# Patient Record
Sex: Female | Born: 1995 | Race: Black or African American | Hispanic: No | Marital: Single
Health system: Midwestern US, Community
[De-identification: ages and names within clinical notes are randomized; demographics above are authoritative.]

## PROBLEM LIST (undated history)

## (undated) ENCOUNTER — Inpatient Hospital Stay (HOSPITAL_COMMUNITY): Payer: Self-pay

## (undated) DIAGNOSIS — Z3400 Encounter for supervision of normal first pregnancy, unspecified trimester: Secondary | ICD-10-CM

## (undated) DIAGNOSIS — Z363 Encounter for antenatal screening for malformations: Secondary | ICD-10-CM

## (undated) DIAGNOSIS — Z3490 Encounter for supervision of normal pregnancy, unspecified, unspecified trimester: Secondary | ICD-10-CM

## (undated) DIAGNOSIS — Z362 Encounter for other antenatal screening follow-up: Secondary | ICD-10-CM

## (undated) DIAGNOSIS — F329 Major depressive disorder, single episode, unspecified: Secondary | ICD-10-CM

## (undated) DIAGNOSIS — F419 Anxiety disorder, unspecified: Secondary | ICD-10-CM

## (undated) DIAGNOSIS — F32A Depression, unspecified: Secondary | ICD-10-CM

## (undated) DIAGNOSIS — J45909 Unspecified asthma, uncomplicated: Secondary | ICD-10-CM

## (undated) HISTORY — PX: NO PAST SURGERIES: SHX2092

## (undated) MED ORDER — MEDROXYPROGESTERONE (DEPO-PROVERA) 150 MG/ML IM SYRINGE
150 mg/mL | INJECTION | Freq: Once | INTRAMUSCULAR | Status: AC
Start: ? — End: 2012-09-01

## (undated) MED ORDER — PRENATAL VIT-IRON FUMARATE-FA 28 MG-0.8 MG TABLET
28 mg iron- 800 mcg | ORAL_TABLET | Freq: Every day | ORAL | Status: DC
Start: ? — End: 2012-06-30

## (undated) MED ORDER — OXYCODONE-ACETAMINOPHEN 5 MG-325 MG TAB
5-325 mg | ORAL_TABLET | ORAL | Status: DC | PRN
Start: ? — End: 2012-09-01

## (undated) MED ORDER — PRENATAL VIT-IRON FUMARATE-FA 28 MG-0.8 MG TABLET: 28 mg iron- 800 mcg | ORAL_TABLET | Freq: Every day | ORAL | Status: AC

---
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
## 2014-02-25 ENCOUNTER — Encounter (HOSPITAL_COMMUNITY): Payer: Self-pay | Admitting: Emergency Medicine

## 2014-02-25 ENCOUNTER — Emergency Department (HOSPITAL_COMMUNITY)
Admission: EM | Admit: 2014-02-25 | Discharge: 2014-02-25 | Disposition: A | Discharge status: Home / Self Care | Payer: Medicaid Other | Attending: Emergency Medicine | Admitting: Emergency Medicine

## 2014-02-25 DIAGNOSIS — J45909 Unspecified asthma, uncomplicated: Secondary | ICD-10-CM | POA: Diagnosis not present

## 2014-02-25 DIAGNOSIS — Z7952 Long term (current) use of systemic steroids: Secondary | ICD-10-CM | POA: Insufficient documentation

## 2014-02-25 DIAGNOSIS — T148XXA Other injury of unspecified body region, initial encounter: Secondary | ICD-10-CM

## 2014-02-25 DIAGNOSIS — Z79899 Other long term (current) drug therapy: Secondary | ICD-10-CM | POA: Diagnosis not present

## 2014-02-25 DIAGNOSIS — M7981 Nontraumatic hematoma of soft tissue: Secondary | ICD-10-CM | POA: Diagnosis not present

## 2014-02-25 LAB — CBC WITH DIFFERENTIAL/PLATELET
BASOS ABS: 0 10*3/uL (ref 0.0–0.1)
Basophils Relative: 1 % (ref 0–1)
Eosinophils Absolute: 0.1 10*3/uL (ref 0.0–0.7)
Eosinophils Relative: 2 % (ref 0–5)
HEMATOCRIT: 40.1 % (ref 36.0–46.0)
Hemoglobin: 12.9 g/dL (ref 12.0–15.0)
LYMPHS ABS: 1.5 10*3/uL (ref 0.7–4.0)
Lymphocytes Relative: 30 % (ref 12–46)
MCH: 28.2 pg (ref 26.0–34.0)
MCHC: 32.2 g/dL (ref 30.0–36.0)
MCV: 87.7 fL (ref 78.0–100.0)
MONO ABS: 1.1 10*3/uL — AB (ref 0.1–1.0)
MONOS PCT: 21 % — AB (ref 3–12)
NEUTROS PCT: 46 % (ref 43–77)
Neutro Abs: 2.4 10*3/uL (ref 1.7–7.7)
PLATELETS: 343 10*3/uL (ref 150–400)
RBC: 4.57 MIL/uL (ref 3.87–5.11)
RDW: 13.1 % (ref 11.5–15.5)
WBC: 5.1 10*3/uL (ref 4.0–10.5)

## 2014-02-25 LAB — COMPREHENSIVE METABOLIC PANEL
ALBUMIN: 3.9 g/dL (ref 3.5–5.2)
ALK PHOS: 59 U/L (ref 39–117)
ALT: 7 U/L (ref 0–35)
ANION GAP: 13 (ref 5–15)
AST: 15 U/L (ref 0–37)
BILIRUBIN TOTAL: 0.3 mg/dL (ref 0.3–1.2)
BUN: 13 mg/dL (ref 6–23)
CHLORIDE: 102 meq/L (ref 96–112)
CO2: 24 mEq/L (ref 19–32)
Calcium: 9.1 mg/dL (ref 8.4–10.5)
Creatinine, Ser: 0.83 mg/dL (ref 0.50–1.10)
GFR calc Af Amer: >90 mL/min (ref >90)
GFR calc non Af Amer: >90 mL/min (ref >90)
Glucose, Bld: 84 mg/dL (ref 70–99)
Potassium: 4 mEq/L (ref 3.7–5.3)
SODIUM: 139 meq/L (ref 137–147)
Total Protein: 7.5 g/dL (ref 6.0–8.3)

## 2014-02-25 LAB — PROTIME-INR
INR: 1.11 (ref 0.00–1.49)
Prothrombin Time: 14.4 seconds (ref 11.6–15.2)

## 2014-02-25 LAB — RETICULOCYTES
RBC.: 4.57 MIL/uL (ref 3.87–5.11)
RETIC COUNT ABSOLUTE: 64 10*3/uL (ref 19.0–186.0)
Retic Ct Pct: 1.4 % (ref 0.4–3.1)

## 2014-02-25 LAB — APTT: APTT: 37 s (ref 24–37)

## 2014-02-25 MED ORDER — OXYCODONE-ACETAMINOPHEN 5-325 MG PO TABS
1.0000 | ORAL_TABLET | Freq: Once | ORAL | Status: AC
  Administered 2014-02-25: 1 via ORAL
  Filled 2014-02-25: qty 1

## 2014-02-25 MED ORDER — TRAMADOL-ACETAMINOPHEN 37.5-325 MG PO TABS: 1.0000 | ORAL_TABLET | Freq: Four times a day (QID) | ORAL | Status: DC | PRN

## 2014-02-25 NOTE — Discharge Instructions (Signed)
Take the prescribed medication as directed. Follow-up with hematology-- see list attached on back. Return to the ED for new or worsening symptoms.

## 2014-02-25 NOTE — ED Notes (Signed)
For a year she has had  bruising spots that have  up and generalized pain sob and cp for a year also has been to see a dr but has no dx yet pain has been getting worse for the last couple of months nights are worse

## 2014-02-25 NOTE — ED Provider Notes (Signed)
CSN: [id number]     Arrival date & time 02/25/14  1125 History  This chart was scribed for non-physician practitioner, Sharilyn SitesLisa Hayley Horn, PA-C,working with Warnell Foresterrey Wofford, MD, by Karle PlumberJennifer Tensley, ED Scribe. This patient was seen in room TR07C/TR07C and the patient's care was started at 12:07 PM.  Chief Complaint  Patient presents with  . Bleeding/Bruising   The history is provided by the patient. No language interpreter was used.    HPI Comments:  Rebecca Eaton is a 18 y.o. female with PMH of asthma who presents to the Emergency Department complaining of spontaneous, mildly painful bruises to all extremities and abdomen that began several months ago. She reports associated constant generalized dull body pain but cannot describe the pain. She states it is worse at night and has occasional sharp chest pains, none currently. Pt was seen at an urgent care in [place] in the past with no specific diagnosis or report of the blood work. States she is starting a new job tomorrow and wanted to be checked out before. Denies modifying factors. Denies fevers, chills, numbness, tingling, anticoagulant problems, anemia, abdominal pain or current CP. Pt is not on anticoagulant therapy and denies taking ASA. Denies family h/o sickle cell or clotting disorders. Denies trauma, injury or falls recently.  Past Medical History  Diagnosis Date  . Asthma    History reviewed. No pertinent past surgical history. No family history on file. History  Substance Use Topics  . Smoking status: Never Smoker   . Smokeless tobacco: Not on file  . Alcohol Use: No   OB History    No data available     Review of Systems  Constitutional: Negative for fever and chills.  Cardiovascular: Negative for chest pain.  Gastrointestinal: Negative for abdominal pain.  Musculoskeletal: Positive for myalgias.  Neurological: Negative for numbness.  Hematological: Bruises/bleeds easily.  All other systems reviewed and are  negative.   Allergies  Review of patient's allergies indicates no known allergies.  Home Medications   Prior to Admission medications   Medication Sig Start Date End Date Taking? Authorizing Provider  albuterol (PROVENTIL HFA;VENTOLIN HFA) 108 (90 BASE) MCG/ACT inhaler Inhale 2 puffs into the lungs every 6 (six) hours as needed. For shortness of breath    Historical Provider, MD  albuterol (PROVENTIL HFA;VENTOLIN HFA) 108 (90 BASE) MCG/ACT inhaler Inhale 2 puffs into the lungs every 4 (four) hours as needed for wheezing. 10/03/11 10/02/12  Lyanne CoKevin M Campos, MD  montelukast (SINGULAIR) 10 MG tablet Take 10 mg by mouth at bedtime.    Historical Provider, MD  predniSONE (DELTASONE) 10 MG tablet Take 6 tablets (60 mg total) by mouth daily. 10/03/11   Lyanne CoKevin M Campos, MD   Triage Vitals: BP 124/72 mmHg  Pulse 87  Temp(Src) 98.2 F (36.8 C) (Oral)  Resp 16  SpO2 100% Physical Exam  Constitutional: She is oriented to person, place, and time. She appears well-developed and well-nourished. No distress.  HENT:  Head: Normocephalic and atraumatic.  Mouth/Throat: Oropharynx is clear and moist.  Eyes: Conjunctivae and EOM are normal. Pupils are equal, round, and reactive to light.  Neck: Normal range of motion. Neck supple.  Cardiovascular: Normal rate, regular rhythm and normal heart sounds.   Pulmonary/Chest: Effort normal and breath sounds normal. No respiratory distress. She has no wheezes.  Abdominal: Soft. Bowel sounds are normal. There is no tenderness. There is no guarding.  Musculoskeletal: Normal range of motion.  Neurological: She is alert and oriented to person, place, and time.  Skin: Skin is warm and dry. Ecchymosis noted. She is not diaphoretic. No erythema.  Multiple areas of bruising on bilateral upper extremities in various stages of healing. No visible signs of trauma or bony deformities.  No active bleeding.  Psychiatric: She has a normal mood and affect.  Nursing note and vitals  reviewed.   ED Course  Procedures (including critical care time) DIAGNOSTIC STUDIES: Oxygen Saturation is 100% on RA, normal by my interpretation.   COORDINATION OF CARE: 12:10 PM- Will order lab work. Pt verbalizes understanding and agrees to plan.  Medications  oxyCODONE-acetaminophen (PERCOCET/ROXICET) 5-325 MG per tablet 1 tablet (not administered)    Labs Review Labs Reviewed  CBC WITH DIFFERENTIAL - Abnormal; Notable for the following:    Monocytes Relative 21 (*)    Monocytes Absolute 1.1 (*)    All other components within normal limits  COMPREHENSIVE METABOLIC PANEL  PROTIME-INR  APTT  RETICULOCYTES    Imaging Review No results found.   EKG Interpretation None      MDM   Final diagnoses:  Bruising   18 y.o. F with with intermittent, atraumatic bruising over the past year.  Has been seen for this multiple times without formal diagnosis.  No personal or family hx of clotting or coagulation disorders.  Notes generalized body pain, no chest pain or abdominal pain currently.  No hx of SCA.  On exam, multiples areas of bruising on BUE in various stages of healing.  No bony deformities or signs of trauma noted.  Abdominal exam benign, lungs clear bilaterally.  Basic labs and coag studies were obtained-- normal platelet count, no coagulation abnormalities.  Recommended close FU with hematology.  Discussed plan with patient, he/she acknowledged understanding and agreed with plan of care.  Return precautions given for new or worsening symptoms.  I personally performed the services described in this documentation, which was scribed in my presence. The recorded information has been reviewed and is accurate.  Garlon HatchetLisa M Krish Bailly, PA-C 02/25/14 1416  Warnell Foresterrey Wofford, MD 02/25/14 1650

## 2015-04-05 ENCOUNTER — Emergency Department (HOSPITAL_COMMUNITY): Payer: Medicaid Other

## 2015-04-05 ENCOUNTER — Emergency Department (HOSPITAL_COMMUNITY)
Admission: EM | Admit: 2015-04-05 | Discharge: 2015-04-05 | Disposition: A | Discharge status: Home / Self Care | Payer: Medicaid Other | Attending: Emergency Medicine | Admitting: Emergency Medicine

## 2015-04-05 ENCOUNTER — Encounter (HOSPITAL_COMMUNITY): Payer: Self-pay | Admitting: Emergency Medicine

## 2015-04-05 DIAGNOSIS — J45909 Unspecified asthma, uncomplicated: Secondary | ICD-10-CM | POA: Insufficient documentation

## 2015-04-05 DIAGNOSIS — Z7952 Long term (current) use of systemic steroids: Secondary | ICD-10-CM | POA: Insufficient documentation

## 2015-04-05 DIAGNOSIS — Z79899 Other long term (current) drug therapy: Secondary | ICD-10-CM | POA: Insufficient documentation

## 2015-04-05 DIAGNOSIS — M25462 Effusion, left knee: Secondary | ICD-10-CM | POA: Insufficient documentation

## 2015-04-05 NOTE — ED Notes (Signed)
See pa note.  

## 2015-04-05 NOTE — Discharge Instructions (Signed)
Use RICE therapy (rest, ice, compression and elevation) for your knee. Use OTC anti-inflammatories as needed for the pain. Call the orthopedic doctor if your swelling and pain persists. Return to ED with fevers, chills, nausea, vomiting, increased swelling of the knee, increased pain of the knee, redness of the knee, heat of the knee, inability to bend the knee or any other new or concerning symptoms.    Knee Effusion Knee effusion means that you have excess fluid in your knee joint. This can cause pain and swelling in your knee. This may make your knee more difficult to bend and move. That is because there is increased pain and pressure in the joint. If there is fluid in your knee, it often means that something is wrong inside your knee, such as severe arthritis, abnormal inflammation, or an infection. Another common cause of knee effusion is an injury to the knee muscles, ligaments, or cartilage. HOME CARE INSTRUCTIONS  Use crutches as directed by your health care provider.  Wear a knee brace as directed by your health care provider.  Apply ice to the swollen area:  Put ice in a plastic bag.  Place a towel between your skin and the bag.  Leave the ice on for 20 minutes, 2-3 times per day.  Keep your knee raised (elevated) when you are sitting or lying down.  Take medicines only as directed by your health care provider.  Do any rehabilitation or strengthening exercises as directed by your health care provider.  Rest your knee as directed by your health care provider. You may start doing your normal activities again when your health care provider approves.   Keep all follow-up visits as directed by your health care provider. This is important. SEEK MEDICAL CARE IF:  You have ongoing (persistent) pain in your knee. SEEK IMMEDIATE MEDICAL CARE IF:  You have increased swelling or redness of your knee.  You have severe pain in your knee.  You have a fever.   This information is  not intended to replace advice given to you by your health care provider. Make sure you discuss any questions you have with your health care provider.   Document Released: 06/29/2003 Document Revised: 04/29/2014 Document Reviewed: 11/22/2013 Elsevier Interactive Patient Education [employer] Else[employer] Inc.

## 2015-04-05 NOTE — ED Notes (Signed)
Pt from home for eval of left knee pain and swelling for a few days, pt states noticed swelling after coming home for work. Pt ambulatory to room from waiting room.

## 2015-04-05 NOTE — ED Provider Notes (Signed)
CSN: [id number]     Arrival date & time 04/05/15  2034 History  By signing my name below, I, Evon Slack, attest that this documentation has been prepared under the direction and in the presence of Zira Helinski, PA-C. Electronically Signed: Evon Slack, ED Scribe. 04/05/2015. 11:04 PM.     Chief Complaint  Patient presents with  . Joint Swelling   The history is provided by the patient. No language interpreter was used.   HPI Comments: Rebecca Eaton is a 19 y.o. female who presents to the Emergency Department complaining of recurrent left knee swelling onset 2 days prior. She reports a chronic, intermittent, shooting pain in the left knee that she rates 4/10 but states that the swelling is new. The pain is mainly anterior and posterior. Pt states that she has tried elevating the knee that provided temporary relief. Pt states that the pain is worse when standing or palpating the knee. Pt does report that she does a lot of standing while at work and notes that her knee pain and swelling are worse after standing for long periods. No personal or family history of rheumatological disorders. Denies fever, chills or other related symptoms. Denies fall, injury or trauma to the left knee.  Past Medical History  Diagnosis Date  . Asthma    History reviewed. No pertinent past surgical history. No family history on file. Social History  Substance Use Topics  . Smoking status: Never Smoker   . Smokeless tobacco: None  . Alcohol Use: No   OB History    No data available     Review of Systems  Musculoskeletal: Positive for joint swelling and arthralgias.  All other systems reviewed and are negative.     Allergies  Review of patient's allergies indicates no known allergies.  Home Medications   Prior to Admission medications   Medication Sig Start Date End Date Taking? Authorizing Provider  albuterol (PROVENTIL HFA;VENTOLIN HFA) 108 (90 BASE) MCG/ACT inhaler Inhale 2 puffs into  the lungs every 6 (six) hours as needed. For shortness of breath    Historical Provider, MD  albuterol (PROVENTIL HFA;VENTOLIN HFA) 108 (90 BASE) MCG/ACT inhaler Inhale 2 puffs into the lungs every 4 (four) hours as needed for wheezing. 10/03/11 10/02/12  Azalia Bilis, MD  montelukast (SINGULAIR) 10 MG tablet Take 10 mg by mouth at bedtime.    Historical Provider, MD  predniSONE (DELTASONE) 10 MG tablet Take 6 tablets (60 mg total) by mouth daily. 10/03/11   Azalia Bilis, MD  traMADol-acetaminophen (ULTRACET) 37.5-325 MG per tablet Take 1 tablet by mouth every 6 (six) hours as needed. 02/25/14   Garlon Hatchet, PA-C   BP 115/76 mmHg  Pulse 83  Temp(Src) 98.3 F (36.8 C) (Oral)  Resp 18  SpO2 100%  LMP 03/20/2015   Physical Exam  Constitutional: She is oriented to person, place, and time. She appears well-developed and well-nourished. No distress.  HENT:  Head: Normocephalic and atraumatic.  Eyes: Conjunctivae and EOM are normal.  Neck: Neck supple. No tracheal deviation present.  Cardiovascular: Normal rate and intact distal pulses.   Pedal pulses palpable. Cap refill < 3 seconds  Pulmonary/Chest: Effort normal. No respiratory distress.  Musculoskeletal: Normal range of motion.       Left knee: She exhibits effusion. She exhibits normal range of motion, no ecchymosis, no deformity, no erythema and normal alignment. Tenderness found.       Legs: Mild generalized tenderness of the left knee. Moderate sized effusion present. Pt  retains FROM of the left knee and is able to ambulate though she favors the left leg. Crepitus palpated on ROM of the knee. No erythema or warmth of the knee. No malalignment or deformity of the knee.   Neurological: She is alert and oriented to person, place, and time.  5/5 strength of the BLE. Sensation to light touch intact throughout.   Skin: Skin is warm and dry.  Psychiatric: She has a normal mood and affect. Her behavior is normal.  Nursing note and vitals  reviewed.   ED Course  Procedures (including critical care time) DIAGNOSTIC STUDIES: Oxygen Saturation is 100% on RA, normal by my interpretation.    COORDINATION OF CARE: 9:10 PM-Discussed treatment plan with pt at bedside and pt agreed to plan.   Labs Review Labs Reviewed - No data to display  Imaging Review Dg Knee Complete 4 Views Left  04/05/2015  CLINICAL DATA:  LEFT knee pain for a few days anteriorly and posteriorly with weight-bearing, swelling, no known injury EXAM: LEFT KNEE - COMPLETE 4+ VIEW COMPARISON:  None FINDINGS: Bone mineralization normal. Joint spaces preserved. No fracture, dislocation, or bone destruction. Knee joint effusion present. IMPRESSION: Knee joint effusion without focal bony abnormality. Electronically Signed   By: Ulyses SouthwardMark  Boles M.D.   On: 04/05/2015 21:59      EKG Interpretation None      MDM   Final diagnoses:  Knee swelling, left   Patient presenting with left knee pain and effusion. Pain and swelling are intermittent but most severe after she has been on her feet for long periods of time. VSS. Pt is nontoxic appearing. Left leg is neurovascularly intact with FROM. Knee with moderate sized effusion without erythema or warmth. Patient X-Ray positive for joint effusion without fracture or dislocation. Pt is able to ambulate with a steady gait favoring the left leg. Presentation is not consistent with a septic joint. Knee sleeve given and conservative therapy recommended. Discussed RICE therapy and use of OTC pain relievers. Pt advised to follow up with orthopedics if symptoms persist. Case discussed with Dr. Bebe ShaggyWickline who agrees with plan. Return precautions discussed at bedside and given in discharge paperwork. Pt is stable for discharge.  I personally performed the services described in this documentation, which was scribed in my presence. The recorded information has been reviewed and is accurate.     Alveta HeimlichStevi Lurie Mullane, PA-C 04/05/15 2308  Zadie Rhineonald  Wickline, MD 04/05/15 [phone]

## 2016-01-25 ENCOUNTER — Encounter (HOSPITAL_COMMUNITY): Payer: Self-pay | Admitting: *Deleted

## 2016-01-25 ENCOUNTER — Emergency Department (HOSPITAL_COMMUNITY)
Admission: EM | Admit: 2016-01-25 | Discharge: 2016-01-25 | Disposition: A | Discharge status: Home / Self Care | Payer: Medicaid Other | Attending: Emergency Medicine | Admitting: Emergency Medicine

## 2016-01-25 DIAGNOSIS — R1033 Periumbilical pain: Secondary | ICD-10-CM | POA: Insufficient documentation

## 2016-01-25 DIAGNOSIS — J45909 Unspecified asthma, uncomplicated: Secondary | ICD-10-CM | POA: Insufficient documentation

## 2016-01-25 LAB — COMPREHENSIVE METABOLIC PANEL
ALBUMIN: 3.9 g/dL (ref 3.5–5.0)
ALT: 9 U/L — ABNORMAL LOW (ref 14–54)
ANION GAP: 8 (ref 5–15)
AST: 17 U/L (ref 15–41)
Alkaline Phosphatase: 53 U/L (ref 38–126)
BILIRUBIN TOTAL: 0.5 mg/dL (ref 0.3–1.2)
BUN: 12 mg/dL (ref 6–20)
CHLORIDE: 105 mmol/L (ref 101–111)
CO2: 25 mmol/L (ref 22–32)
CREATININE: 0.98 mg/dL (ref 0.44–1.00)
Calcium: 9.4 mg/dL (ref 8.9–10.3)
GFR calc Af Amer: >60 mL/min (ref >60)
GFR calc non Af Amer: >60 mL/min (ref >60)
GLUCOSE: 87 mg/dL (ref 65–99)
POTASSIUM: 3.5 mmol/L (ref 3.5–5.1)
SODIUM: 138 mmol/L (ref 135–145)
Total Protein: 7 g/dL (ref 6.5–8.1)

## 2016-01-25 LAB — CBC
HEMATOCRIT: 40 % (ref 36.0–46.0)
Hemoglobin: 12.2 g/dL (ref 12.0–15.0)
MCH: 26.9 pg (ref 26.0–34.0)
MCHC: 30.5 g/dL (ref 30.0–36.0)
MCV: 88.1 fL (ref 78.0–100.0)
PLATELETS: 359 10*3/uL (ref 150–400)
RBC: 4.54 MIL/uL (ref 3.87–5.11)
RDW: 12.7 % (ref 11.5–15.5)
WBC: 8.2 10*3/uL (ref 4.0–10.5)

## 2016-01-25 LAB — URINALYSIS, ROUTINE W REFLEX MICROSCOPIC
BILIRUBIN URINE: NEGATIVE
GLUCOSE, UA: NEGATIVE mg/dL
HGB URINE DIPSTICK: NEGATIVE
KETONES UR: NEGATIVE mg/dL
Leukocytes, UA: NEGATIVE
Nitrite: NEGATIVE
PH: 6 (ref 5.0–8.0)
PROTEIN: NEGATIVE mg/dL
Specific Gravity, Urine: 1.023 (ref 1.005–1.030)

## 2016-01-25 LAB — POC URINE PREG, ED: Preg Test, Ur: NEGATIVE

## 2016-01-25 LAB — LIPASE, BLOOD: LIPASE: 28 U/L (ref 11–51)

## 2016-01-25 MED ORDER — GI COCKTAIL ~~LOC~~
30.0000 mL | Freq: Once | ORAL | Status: AC
  Administered 2016-01-25: 30 mL via ORAL
  Filled 2016-01-25: qty 30

## 2016-01-25 MED ORDER — OMEPRAZOLE 20 MG PO CPDR: 20.0000 mg | DELAYED_RELEASE_CAPSULE | Freq: Every day | ORAL | 0 refills | Status: DC

## 2016-01-25 NOTE — Discharge Instructions (Signed)
As discussed, your evaluation today has been largely reassuring.  But, it is important that you monitor your condition carefully, and do not hesitate to return to the ED if you develop new, or concerning changes in your condition. ? ?Otherwise, please follow-up with your physician for appropriate ongoing care. ? ?

## 2016-01-25 NOTE — ED Triage Notes (Signed)
Pt c/o intermittent abdominal pain for a couple of months. Pt states they feel like pregnancy pains, pt reports negative home pregnancy test. Pt denies n/v/d.

## 2016-01-25 NOTE — ED Provider Notes (Signed)
[facility]-EMERGENCY DEPT Provider Note   CSN: [id number] Arrival date & time: 01/25/16  0213     History   Chief Complaint Chief Complaint  Patient presents with  . Abdominal Pain    HPI Rebecca Eaton is a 20 y.o. female.  HPI  Patient presents with concern of abdominal pain.  Symptoms been present for possibly 3 or 4 weeks, inconsistent, intermittent, typically with soreness in the mid abdomen, periumbilical and superior regions. Symptoms seem worse with activity, including lifting. There is minor associated nausea, anorexia, but no vomiting, no diarrhea. No urinary complaints. Patient is taking multiple home pregnancy tests, all with negative results. She has notable history of prior Depot Provera shots, but none in months. With persistency of her pain, discomfort, the patient presents for evaluation.  Past Medical History:  Diagnosis Date  . Asthma     There are no active problems to display for this patient.   History reviewed. No pertinent surgical history.  OB History    No data available       Home Medications    Prior to Admission medications   Medication Sig Start Date End Date Taking? Authorizing Provider  albuterol (PROVENTIL HFA;VENTOLIN HFA) 108 (90 BASE) MCG/ACT inhaler Inhale 2 puffs into the lungs every 6 (six) hours as needed. For shortness of breath    Historical Provider, MD  albuterol (PROVENTIL HFA;VENTOLIN HFA) 108 (90 BASE) MCG/ACT inhaler Inhale 2 puffs into the lungs every 4 (four) hours as needed for wheezing. 10/03/11 10/02/12  Azalia BilisKevin Campos, MD  montelukast (SINGULAIR) 10 MG tablet Take 10 mg by mouth at bedtime.    Historical Provider, MD  predniSONE (DELTASONE) 10 MG tablet Take 6 tablets (60 mg total) by mouth daily. 10/03/11   Azalia BilisKevin Campos, MD  traMADol-acetaminophen (ULTRACET) 37.5-325 MG per tablet Take 1 tablet by mouth every 6 (six) hours as needed. 02/25/14   Garlon HatchetLisa M Sanders, PA-C    Family History History reviewed. No pertinent  family history.  Social History Social History  Substance Use Topics  . Smoking status: Never Smoker  . Smokeless tobacco: Never Used  . Alcohol use No     Allergies   Review of patient's allergies indicates no known allergies.   Review of Systems Review of Systems  Constitutional:       Per HPI, otherwise negative  HENT:       Per HPI, otherwise negative  Respiratory:       Per HPI, otherwise negative  Cardiovascular:       Per HPI, otherwise negative  Gastrointestinal: Positive for nausea. Negative for vomiting.  Endocrine:       Negative aside from HPI  Genitourinary: Positive for dysuria and flank pain.  Musculoskeletal:       Per HPI, otherwise negative  Skin: Negative.   Neurological: Negative for syncope and weakness.     Physical Exam Updated Vital Signs BP 122/85   Pulse 83   Temp 97.9 F (36.6 C) (Oral)   Resp 18   Ht 5\' 3"  (1.6 m)   SpO2 100%   Physical Exam  Constitutional: She is oriented to person, place, and time. She appears well-developed and well-nourished. No distress.  HENT:  Head: Normocephalic and atraumatic.  Eyes: Conjunctivae and EOM are normal.  Cardiovascular: Normal rate and regular rhythm.   Pulmonary/Chest: Effort normal and breath sounds normal. No stridor. No respiratory distress.  Abdominal: She exhibits no distension.  Minimal discomfort about the superior umbilical area, no lower abdominal tenderness,  right or left lower quadrants  Musculoskeletal: She exhibits no edema.  Neurological: She is alert and oriented to person, place, and time. No cranial nerve deficit.  Skin: Skin is warm and dry.  Psychiatric: She has a normal mood and affect.  Nursing note and vitals reviewed.    ED Treatments / Results  Labs (all labs ordered are listed, but only abnormal results are displayed) Labs Reviewed  COMPREHENSIVE METABOLIC PANEL - Abnormal; Notable for the following:       Result Value   ALT 9 (*)    All other components  within normal limits  URINALYSIS, ROUTINE W REFLEX MICROSCOPIC (NOT AT [facility]) - Abnormal; Notable for the following:    APPearance CLOUDY (*)    All other components within normal limits  LIPASE, BLOOD  CBC  POC URINE PREG, ED      Procedures Procedures (including critical care time)  Medications Ordered in ED Medications  gi cocktail (Maalox,Lidocaine,Donnatal) (not administered)     Initial Impression / Assessment and Plan / ED Course  I have reviewed the triage vital signs and the nursing notes.  Pertinent labs & imaging results that were available during my care of the patient were reviewed by me and considered in my medical decision making (see chart for details).  Clinical Course    5:52 AM  Patient improved, we reviewed all findings together. With suspicion for gastric etiology, patient will start PPI therapy, follow up with GI.  Name female presents with ongoing episodic abdominal pain. Here she is awake and alert, with soft, non-peritoneal abdomen, mild tenderness in the upper mid abdomen, periumbilical area. No lower abdominal tenderness, low suspicion for atypical appendicitis or ovarian pathology. Patient improved here after initiation GI cocktail suggesting gastric etiology. Patient discharged in stable condition.  Final Clinical Impressions(s) / ED Diagnoses   Final diagnoses:  Periumbilical abdominal pain    New Prescriptions New Prescriptions   OMEPRAZOLE (PRILOSEC) 20 MG CAPSULE    Take 1 capsule (20 mg total) by mouth daily. Take one tablet daily     Gerhard Munch, MD 01/25/16 [phone]

## 2016-01-28 ENCOUNTER — Encounter (HOSPITAL_COMMUNITY): Payer: Self-pay | Admitting: Emergency Medicine

## 2016-01-28 ENCOUNTER — Emergency Department (HOSPITAL_COMMUNITY)
Admission: EM | Admit: 2016-01-28 | Discharge: 2016-01-29 | Disposition: A | Discharge status: Home / Self Care | Payer: Medicaid Other | Attending: Emergency Medicine | Admitting: Emergency Medicine

## 2016-01-28 DIAGNOSIS — R112 Nausea with vomiting, unspecified: Secondary | ICD-10-CM | POA: Insufficient documentation

## 2016-01-28 DIAGNOSIS — J029 Acute pharyngitis, unspecified: Secondary | ICD-10-CM | POA: Insufficient documentation

## 2016-01-28 DIAGNOSIS — J45909 Unspecified asthma, uncomplicated: Secondary | ICD-10-CM | POA: Insufficient documentation

## 2016-01-28 DIAGNOSIS — R0602 Shortness of breath: Secondary | ICD-10-CM | POA: Insufficient documentation

## 2016-01-28 LAB — CBC
HEMATOCRIT: 36.5 % (ref 36.0–46.0)
HEMOGLOBIN: 12.1 g/dL (ref 12.0–15.0)
MCH: 27.9 pg (ref 26.0–34.0)
MCHC: 33.2 g/dL (ref 30.0–36.0)
MCV: 84.1 fL (ref 78.0–100.0)
Platelets: 319 10*3/uL (ref 150–400)
RBC: 4.34 MIL/uL (ref 3.87–5.11)
RDW: 12.6 % (ref 11.5–15.5)
WBC: 13 10*3/uL — AB (ref 4.0–10.5)

## 2016-01-28 LAB — COMPREHENSIVE METABOLIC PANEL
ALBUMIN: 3.9 g/dL (ref 3.5–5.0)
ALT: 8 U/L — ABNORMAL LOW (ref 14–54)
AST: 15 U/L (ref 15–41)
Alkaline Phosphatase: 52 U/L (ref 38–126)
Anion gap: 7 (ref 5–15)
BUN: 11 mg/dL (ref 6–20)
CHLORIDE: 107 mmol/L (ref 101–111)
CO2: 24 mmol/L (ref 22–32)
Calcium: 9.1 mg/dL (ref 8.9–10.3)
Creatinine, Ser: 0.8 mg/dL (ref 0.44–1.00)
GFR calc Af Amer: >60 mL/min (ref >60)
GFR calc non Af Amer: >60 mL/min (ref >60)
GLUCOSE: 86 mg/dL (ref 65–99)
POTASSIUM: 3.9 mmol/L (ref 3.5–5.1)
Sodium: 138 mmol/L (ref 135–145)
Total Bilirubin: 0.4 mg/dL (ref 0.3–1.2)
Total Protein: 7.5 g/dL (ref 6.5–8.1)

## 2016-01-28 LAB — I-STAT BETA HCG BLOOD, ED (MC, WL, AP ONLY): I-stat hCG, quantitative: <5 m[IU]/mL (ref <5)

## 2016-01-28 LAB — URINALYSIS, ROUTINE W REFLEX MICROSCOPIC
Bilirubin Urine: NEGATIVE
GLUCOSE, UA: NEGATIVE mg/dL
Hgb urine dipstick: NEGATIVE
KETONES UR: NEGATIVE mg/dL
LEUKOCYTES UA: NEGATIVE
Nitrite: NEGATIVE
PH: 6 (ref 5.0–8.0)
Protein, ur: NEGATIVE mg/dL
SPECIFIC GRAVITY, URINE: 1.025 (ref 1.005–1.030)

## 2016-01-28 LAB — LIPASE, BLOOD: LIPASE: 24 U/L (ref 11–51)

## 2016-01-28 MED ORDER — GI COCKTAIL ~~LOC~~
30.0000 mL | Freq: Once | ORAL | Status: AC
  Administered 2016-01-29: 30 mL via ORAL
  Filled 2016-01-28: qty 30

## 2016-01-28 NOTE — ED Triage Notes (Signed)
Pt reports being seen on 01/26/16 for upset stomach and given prescription for acid indigestion. Pt now reporting nausea and vomiting that started today.

## 2016-01-29 MED ORDER — ONDANSETRON 4 MG PO TBDP: 4.0000 mg | ORAL_TABLET | Freq: Three times a day (TID) | ORAL | 0 refills | Status: DC | PRN

## 2016-01-29 NOTE — ED Provider Notes (Signed)
[facility]-EMERGENCY DEPT Provider Note   CSN: [id number] Arrival date & time: 01/28/16  2123     History   Chief Complaint Chief Complaint  Patient presents with  . Emesis    HPI Rebecca Eaton is a 20 y.o. female.  Patient presents with complaint of nausea and epigastric tenderness. Patient states that she has had "soreness" in her abdomen for several months. She was seen in emergency department on 01/25/16 with reassuring lab work. She was started on PPI which she has been taking. Tonight the patient's symptoms were worse. She has had associated sore throat and body aches. She is concerned that she was developing an infection. She was given a GI referral at previous visit. She has had not had any change in her abdominal pain. No fevers, vomiting, or diarrhea. Denies heavy NSAID or alcohol use. No history of ulcers and pain is not made worse with food. Pain does not radiate. No associated URI symptoms, ear pain. No urinary symptoms. No other treatments prior to arrival. Onset of symptoms insidious. Course is constant. Nothing makes symptoms better or worse.    Past Medical History:  Diagnosis Date  . Asthma     There are no active problems to display for this patient.   History reviewed. No pertinent surgical history.  OB History    No data available       Home Medications    Prior to Admission medications   Medication Sig Start Date End Date Taking? Authorizing Provider  omeprazole (PRILOSEC) 20 MG capsule Take 1 capsule (20 mg total) by mouth daily. Take one tablet daily 01/25/16  Yes Gerhard Munch, MD    Family History History reviewed. No pertinent family history.  Social History Social History  Substance Use Topics  . Smoking status: Never Smoker  . Smokeless tobacco: Never Used  . Alcohol use No     Allergies   Review of patient's allergies indicates no known allergies.   Review of Systems Review of Systems  Constitutional: Negative for fever.  HENT:  Positive for sore throat. Negative for rhinorrhea.   Eyes: Negative for redness.  Respiratory: Negative for cough.   Cardiovascular: Negative for chest pain.  Gastrointestinal: Positive for abdominal pain and nausea. Negative for diarrhea and vomiting.  Genitourinary: Negative for dysuria.  Musculoskeletal: Positive for myalgias.  Skin: Negative for rash.  Neurological: Negative for headaches.     Physical Exam Updated Vital Signs BP 117/72   Pulse (!) 131   Temp 99.2 F (37.3 C) (Oral)   Resp 16   Ht 5\' 3"  (1.6 m)   Wt 61.2 kg   SpO2 95%   BMI 23.91 kg/m   Physical Exam  Constitutional: She appears well-developed and well-nourished.  HENT:  Head: Normocephalic and atraumatic.  Right Ear: Tympanic membrane normal.  Left Ear: Tympanic membrane normal.  Nose: Nose normal.  Mouth/Throat: Uvula is midline and oropharynx is clear and moist.  Eyes: Conjunctivae are normal. Right eye exhibits no discharge. Left eye exhibits no discharge.  Neck: Normal range of motion. Neck supple.  Cardiovascular: Normal rate, regular rhythm and normal heart sounds.   Pulmonary/Chest: Effort normal and breath sounds normal. No respiratory distress. She has no wheezes. She has no rales.  Abdominal: Soft. She exhibits no mass. There is tenderness (minimal epigastric tenderness). There is no guarding.  Neurological: She is alert.  Skin: Skin is warm and dry.  Psychiatric: She has a normal mood and affect.  Nursing note and vitals reviewed.  ED Treatments / Results  Labs (all labs ordered are listed, but only abnormal results are displayed) Labs Reviewed  COMPREHENSIVE METABOLIC PANEL - Abnormal; Notable for the following:       Result Value   ALT 8 (*)    All other components within normal limits  CBC - Abnormal; Notable for the following:    WBC 13.0 (*)    All other components within normal limits  URINALYSIS, ROUTINE W REFLEX MICROSCOPIC (NOT AT [facility]) - Abnormal; Notable for the  following:    APPearance CLOUDY (*)    All other components within normal limits  LIPASE, BLOOD  I-STAT BETA HCG BLOOD, ED ([facility], [facility], [facility] ONLY)    EKG  EKG Interpretation None       Radiology No results found.  Procedures Procedures (including critical care time)  Medications Ordered in ED Medications  gi cocktail (Maalox,Lidocaine,Donnatal) (30 mLs Oral Given 01/29/16 0007)     Initial Impression / Assessment and Plan / ED Course  I have reviewed the triage vital signs and the nursing notes.  Pertinent labs & imaging results that were available during my care of the patient were reviewed by me and considered in my medical decision making (see chart for details).  Clinical Course   Vital signs reviewed and are as follows: Vitals:   01/28/16 2344 01/29/16 0037  BP: 117/72 99/64  Pulse: (!) 131 91  Resp: 16   Temp:      Patient seen and examined.Exam is relatively benign. Will give GI cocktail and reassess. Likely will discharge to home with Zofran. Encouraged GI follow-up as planned. Reviewed labs from previous visit. Patient does have a mildly elevated white blood cell count tonight. Otherwise no change.  1:02 AM Patient with resolution of pain with GI cocktail. She is ready for d/c to home.   The patient was urged to return to the Emergency Department immediately with worsening of current symptoms, worsening abdominal pain, persistent vomiting, blood noted in stools, fever, or any other concerns. The patient verbalized understanding.    Final Clinical Impressions(s) / ED Diagnoses   Final diagnoses:  Non-intractable vomiting with nausea, unspecified vomiting type  Sore throat   Patient with epigastric abdominal pain, suspect GI etiology. Vitals are stable, no fever. Labs with mild leukocytosis, otherwise unchanged. No focal RUQ tenderness. No signs of dehydration, patient is tolerating PO's. Lungs are clear and no signs suggestive of PNA. Low concern for  appendicitis, cholecystitis, pancreatitis, ruptured viscus, UTI, kidney stone, aortic dissection, aortic aneurysm or other emergent abdominal etiology. Supportive therapy indicated with return if symptoms worsen.     New Prescriptions New Prescriptions   ONDANSETRON (ZOFRAN ODT) 4 MG DISINTEGRATING TABLET    Take 1 tablet (4 mg total) by mouth every 8 (eight) hours as needed for nausea or vomiting.     Renne CriglerJoshua Dimitri Dsouza, PA-C 01/29/16 0104    Arby BarretteMarcy Pfeiffer, MD 01/31/16 [phone]

## 2016-01-29 NOTE — Discharge Instructions (Signed)
Please read and follow all provided instructions.  Your diagnoses today include:  1. Non-intractable vomiting with nausea, unspecified vomiting type   2. Sore throat     Tests performed today include:  Blood counts and electrolytes - slightly high white blood cell count  Blood tests to check liver and kidney function  Blood tests to check pancreas function  Urine test to look for infection and pregnancy (in women)  Vital signs. See below for your results today.   Medications prescribed:   Zofran (ondansetron) - for nausea and vomiting  Take any prescribed medications only as directed.  Home care instructions:   Follow any educational materials contained in this packet.  Follow-up instructions: Please follow-up with your primary care provider in the next 3 days for further evaluation of your symptoms.    Return instructions:  SEEK IMMEDIATE MEDICAL ATTENTION IF:  The pain does not go away or becomes severe   A temperature above 101F develops   Repeated vomiting occurs (multiple episodes)   The pain becomes localized to portions of the abdomen. The right side could possibly be appendicitis. In an adult, the left lower portion of the abdomen could be colitis or diverticulitis.   Blood is being passed in stools or vomit (bright red or black tarry stools)   You develop chest pain, difficulty breathing, dizziness or fainting, or become confused, poorly responsive, or inconsolable (young children)  If you have any other emergent concerns regarding your health  Additional Information: Abdominal (belly) pain can be caused by many things. Your caregiver performed an examination and possibly ordered blood/urine tests and imaging (CT scan, x-rays, ultrasound). Many cases can be observed and treated at home after initial evaluation in the emergency department. Even though you are being discharged home, abdominal pain can be unpredictable. Therefore, you need a repeated exam if  your pain does not resolve, returns, or worsens. Most patients with abdominal pain don't have to be admitted to the hospital or have surgery, but serious problems like appendicitis and gallbladder attacks can start out as nonspecific pain. Many abdominal conditions cannot be diagnosed in one visit, so follow-up evaluations are very important.  Your vital signs today were: BP 99/64    Pulse 91    Temp 99.2 F (37.3 C) (Oral)    Resp 16    Ht 5\' 3"  (1.6 m)    Wt 61.2 kg    SpO2 100%    BMI 23.91 kg/m  If your blood pressure (bp) was elevated above 135/85 this visit, please have this repeated by your doctor within one month. --------------

## 2016-02-24 ENCOUNTER — Inpatient Hospital Stay (HOSPITAL_COMMUNITY)
Admission: AD | Admit: 2016-02-24 | Discharge: 2016-02-24 | Disposition: A | Discharge status: Home / Self Care | Payer: Medicaid Other | Source: Ambulatory Visit | Attending: Obstetrics and Gynecology | Admitting: Obstetrics and Gynecology

## 2016-02-24 ENCOUNTER — Encounter (HOSPITAL_COMMUNITY): Payer: Self-pay | Admitting: *Deleted

## 2016-02-24 DIAGNOSIS — N939 Abnormal uterine and vaginal bleeding, unspecified: Secondary | ICD-10-CM

## 2016-02-24 DIAGNOSIS — R109 Unspecified abdominal pain: Secondary | ICD-10-CM | POA: Insufficient documentation

## 2016-02-24 LAB — WET PREP, GENITAL
Clue Cells Wet Prep HPF POC: NONE SEEN
SPERM: NONE SEEN
TRICH WET PREP: NONE SEEN
YEAST WET PREP: NONE SEEN

## 2016-02-24 LAB — URINALYSIS, ROUTINE W REFLEX MICROSCOPIC
BILIRUBIN URINE: NEGATIVE
GLUCOSE, UA: NEGATIVE mg/dL
KETONES UR: NEGATIVE mg/dL
LEUKOCYTES UA: NEGATIVE
NITRITE: NEGATIVE
PH: 5.5 (ref 5.0–8.0)
PROTEIN: NEGATIVE mg/dL
Specific Gravity, Urine: >1.03 — ABNORMAL HIGH (ref 1.005–1.030)

## 2016-02-24 LAB — CBC
HCT: 37.9 % (ref 36.0–46.0)
Hemoglobin: 12.4 g/dL (ref 12.0–15.0)
MCH: 28 pg (ref 26.0–34.0)
MCHC: 32.7 g/dL (ref 30.0–36.0)
MCV: 85.6 fL (ref 78.0–100.0)
PLATELETS: 377 10*3/uL (ref 150–400)
RBC: 4.43 MIL/uL (ref 3.87–5.11)
RDW: 13.2 % (ref 11.5–15.5)
WBC: 8.5 10*3/uL (ref 4.0–10.5)

## 2016-02-24 LAB — URINE MICROSCOPIC-ADD ON

## 2016-02-24 LAB — POCT PREGNANCY, URINE: Preg Test, Ur: NEGATIVE

## 2016-02-24 MED ORDER — NORGESTIMATE-ETH ESTRADIOL 0.25-35 MG-MCG PO TABS: 1.0000 | ORAL_TABLET | Freq: Every day | ORAL | 3 refills | Status: DC

## 2016-02-24 NOTE — Discharge Instructions (Signed)

## 2016-02-24 NOTE — MAU Provider Note (Signed)
History     CSN: [id number]  Arrival date and time: 02/24/16 1831   None     Chief Complaint  Patient presents with  . Abdominal Pain  . Vaginal Bleeding   G1P1001, non-pregnant female here with heavy vaginal bleeding and passing clots since this am. She reports changing pads multiple times today but cannot quantify. She describes the clots as dime sized. She also c/o abdominal cramping since 8am. Sitting or standing too long makes pain worse. She reports lifting heavy boxes about 2 days ago. She has not used analgesics. No recent IC. She denies vaginal discharge or malodor prior to VB. No fevers. She denies urinary sx. No new partner in 4 years. She was using Depo but had last shot in February 2017. She is attempting conception currently.     Pertinent Gynecological History: Menses: 02/09/16 Contraception: none Blood transfusions: none Sexually transmitted diseases: no past history   Past Medical History:  Diagnosis Date  . Asthma     Past Surgical History:  Procedure Laterality Date  . NO PAST SURGERIES      History reviewed. No pertinent family history.  Social History  Substance Use Topics  . Smoking status: Never Smoker  . Smokeless tobacco: Never Used  . Alcohol use No    Allergies: No Known Allergies  Prescriptions Prior to Admission  Medication Sig Dispense Refill Last Dose  . omeprazole (PRILOSEC) 20 MG capsule Take 1 capsule (20 mg total) by mouth daily. Take one tablet daily 21 capsule 0 01/28/2016 at Unknown time  . ondansetron (ZOFRAN ODT) 4 MG disintegrating tablet Take 1 tablet (4 mg total) by mouth every 8 (eight) hours as needed for nausea or vomiting. 10 tablet 0     Recent Results (from the past 2160 hour(s))  Lipase, blood     Status: None   Collection Time: 01/25/16  2:20 AM  Result Value Ref Range   Lipase 28 11 - 51 U/L  Comprehensive metabolic panel     Status: Abnormal   Collection Time: 01/25/16  2:20 AM  Result Value Ref Range   Sodium 138 135 - 145 mmol/L   Potassium 3.5 3.5 - 5.1 mmol/L   Chloride 105 101 - 111 mmol/L   CO2 25 22 - 32 mmol/L   Glucose, Bld 87 65 - 99 mg/dL   BUN 12 6 - 20 mg/dL   Creatinine, Ser 0.98 0.44 - 1.00 mg/dL   Calcium 9.4 8.9 - 10.3 mg/dL   Total Protein 7.0 6.5 - 8.1 g/dL   Albumin 3.9 3.5 - 5.0 g/dL   AST 17 15 - 41 U/L   ALT 9 (L) 14 - 54 U/L   Alkaline Phosphatase 53 38 - 126 U/L   Total Bilirubin 0.5 0.3 - 1.2 mg/dL   GFR calc non Af Amer >60 >60 mL/min   GFR calc Af Amer >60 >60 mL/min    Comment: (NOTE) The eGFR has been calculated using the CKD EPI equation. This calculation has not been validated in all clinical situations. eGFR's persistently <60 mL/min signify possible Chronic Kidney Disease.    Anion gap 8 5 - 15  CBC     Status: None   Collection Time: 01/25/16  2:20 AM  Result Value Ref Range   WBC 8.2 4.0 - 10.5 K/uL   RBC 4.54 3.87 - 5.11 MIL/uL   Hemoglobin 12.2 12.0 - 15.0 g/dL   HCT 40.0 36.0 - 46.0 %   MCV 88.1 78.0 - 100.0 fL  MCH 26.9 26.0 - 34.0 pg   MCHC 30.5 30.0 - 36.0 g/dL   RDW 12.7 11.5 - 15.5 %   Platelets 359 150 - 400 K/uL  Urinalysis, Routine w reflex microscopic     Status: Abnormal   Collection Time: 01/25/16  2:21 AM  Result Value Ref Range   Color, Urine YELLOW YELLOW   APPearance CLOUDY (A) CLEAR   Specific Gravity, Urine 1.023 1.005 - 1.030   pH 6.0 5.0 - 8.0   Glucose, UA NEGATIVE NEGATIVE mg/dL   Hgb urine dipstick NEGATIVE NEGATIVE   Bilirubin Urine NEGATIVE NEGATIVE   Ketones, ur NEGATIVE NEGATIVE mg/dL   Protein, ur NEGATIVE NEGATIVE mg/dL   Nitrite NEGATIVE NEGATIVE   Leukocytes, UA NEGATIVE NEGATIVE    Comment: MICROSCOPIC NOT DONE ON URINES WITH NEGATIVE PROTEIN, BLOOD, LEUKOCYTES, NITRITE, OR GLUCOSE <1000 mg/dL.  POC urine preg, ED     Status: None   Collection Time: 01/25/16  2:41 AM  Result Value Ref Range   Preg Test, Ur NEGATIVE NEGATIVE    Comment:        THE SENSITIVITY OF THIS METHODOLOGY IS >24  mIU/mL   Lipase, blood     Status: None   Collection Time: 01/28/16 10:10 PM  Result Value Ref Range   Lipase 24 11 - 51 U/L  Comprehensive metabolic panel     Status: Abnormal   Collection Time: 01/28/16 10:10 PM  Result Value Ref Range   Sodium 138 135 - 145 mmol/L   Potassium 3.9 3.5 - 5.1 mmol/L   Chloride 107 101 - 111 mmol/L   CO2 24 22 - 32 mmol/L   Glucose, Bld 86 65 - 99 mg/dL   BUN 11 6 - 20 mg/dL   Creatinine, Ser 0.80 0.44 - 1.00 mg/dL   Calcium 9.1 8.9 - 10.3 mg/dL   Total Protein 7.5 6.5 - 8.1 g/dL   Albumin 3.9 3.5 - 5.0 g/dL   AST 15 15 - 41 U/L   ALT 8 (L) 14 - 54 U/L   Alkaline Phosphatase 52 38 - 126 U/L   Total Bilirubin 0.4 0.3 - 1.2 mg/dL   GFR calc non Af Amer >60 >60 mL/min   GFR calc Af Amer >60 >60 mL/min    Comment: (NOTE) The eGFR has been calculated using the CKD EPI equation. This calculation has not been validated in all clinical situations. eGFR's persistently <60 mL/min signify possible Chronic Kidney Disease.    Anion gap 7 5 - 15  CBC     Status: Abnormal   Collection Time: 01/28/16 10:10 PM  Result Value Ref Range   WBC 13.0 (H) 4.0 - 10.5 K/uL   RBC 4.34 3.87 - 5.11 MIL/uL   Hemoglobin 12.1 12.0 - 15.0 g/dL   HCT 36.5 36.0 - 46.0 %   MCV 84.1 78.0 - 100.0 fL   MCH 27.9 26.0 - 34.0 pg   MCHC 33.2 30.0 - 36.0 g/dL   RDW 12.6 11.5 - 15.5 %   Platelets 319 150 - 400 K/uL  I-Stat beta hCG blood, ED     Status: None   Collection Time: 01/28/16 10:44 PM  Result Value Ref Range   I-stat hCG, quantitative <5.0 <5 mIU/mL   Comment 3            Comment:   GEST. AGE      CONC.  (mIU/mL)   <=1 WEEK        5 - 50  2 WEEKS       50 - 500     3 WEEKS       100 - 10,000     4 WEEKS     1,000 - 30,000        FEMALE AND NON-PREGNANT FEMALE:     LESS THAN 5 mIU/mL   Urinalysis, Routine w reflex microscopic     Status: Abnormal   Collection Time: 01/28/16 11:20 PM  Result Value Ref Range   Color, Urine YELLOW YELLOW   APPearance CLOUDY  (A) CLEAR   Specific Gravity, Urine 1.025 1.005 - 1.030   pH 6.0 5.0 - 8.0   Glucose, UA NEGATIVE NEGATIVE mg/dL   Hgb urine dipstick NEGATIVE NEGATIVE   Bilirubin Urine NEGATIVE NEGATIVE   Ketones, ur NEGATIVE NEGATIVE mg/dL   Protein, ur NEGATIVE NEGATIVE mg/dL   Nitrite NEGATIVE NEGATIVE   Leukocytes, UA NEGATIVE NEGATIVE    Comment: MICROSCOPIC NOT DONE ON URINES WITH NEGATIVE PROTEIN, BLOOD, LEUKOCYTES, NITRITE, OR GLUCOSE <1000 mg/dL.  Urinalysis, Routine w reflex microscopic (not at [facility])     Status: Abnormal   Collection Time: 02/24/16  6:45 PM  Result Value Ref Range   Color, Urine YELLOW YELLOW   APPearance CLEAR CLEAR   Specific Gravity, Urine >1.030 (H) 1.005 - 1.030   pH 5.5 5.0 - 8.0   Glucose, UA NEGATIVE NEGATIVE mg/dL   Hgb urine dipstick LARGE (A) NEGATIVE   Bilirubin Urine NEGATIVE NEGATIVE   Ketones, ur NEGATIVE NEGATIVE mg/dL   Protein, ur NEGATIVE NEGATIVE mg/dL   Nitrite NEGATIVE NEGATIVE   Leukocytes, UA NEGATIVE NEGATIVE  Urine microscopic-add on     Status: Abnormal   Collection Time: 02/24/16  6:45 PM  Result Value Ref Range   Squamous Epithelial / LPF 0-5 (A) NONE SEEN   WBC, UA 0-5 0 - 5 WBC/hpf   RBC / HPF 6-30 0 - 5 RBC/hpf   Bacteria, UA FEW (A) NONE SEEN  Pregnancy, urine POC     Status: None   Collection Time: 02/24/16  7:02 PM  Result Value Ref Range   Preg Test, Ur NEGATIVE NEGATIVE    Comment:        THE SENSITIVITY OF THIS METHODOLOGY IS >24 mIU/mL   Wet prep, genital     Status: Abnormal   Collection Time: 02/24/16  7:39 PM  Result Value Ref Range   Yeast Wet Prep HPF POC NONE SEEN NONE SEEN   Trich, Wet Prep NONE SEEN NONE SEEN   Clue Cells Wet Prep HPF POC NONE SEEN NONE SEEN   WBC, Wet Prep HPF POC FEW (A) NONE SEEN    Comment: FEW BACTERIA SEEN   Sperm NONE SEEN   CBC     Status: None   Collection Time: 02/24/16  7:43 PM  Result Value Ref Range   WBC 8.5 4.0 - 10.5 K/uL   RBC 4.43 3.87 - 5.11 MIL/uL   Hemoglobin 12.4  12.0 - 15.0 g/dL   HCT 37.9 36.0 - 46.0 %   MCV 85.6 78.0 - 100.0 fL   MCH 28.0 26.0 - 34.0 pg   MCHC 32.7 30.0 - 36.0 g/dL   RDW 13.2 11.5 - 15.5 %   Platelets 377 150 - 400 K/uL    Review of Systems  Constitutional: Negative.   Gastrointestinal: Positive for abdominal pain and nausea. Negative for constipation, diarrhea and vomiting.  Genitourinary: Negative.    Physical Exam   Blood pressure 91/77, pulse 66,  temperature 97.7 F (36.5 C), temperature source Oral, resp. rate 16, height 5' 2.5" (1.588 m), weight 62.9 kg (138 lb 9.6 oz), last menstrual period 02/09/2016.  Physical Exam  Constitutional: She is oriented to person, place, and time. She appears well-developed and well-nourished.  HENT:  Head: Normocephalic and atraumatic.  Neck: Normal range of motion. Neck supple.  Cardiovascular: Normal rate.   Respiratory: Effort normal.  GI: Soft. She exhibits no distension and no mass. There is no tenderness. There is no rebound and no guarding.  Genitourinary:  Genitourinary Comments: External: no lesions Vagina: rugated, parous, moderate bloody discharge Uterus: non enlarged, anteverted, non tender, no CMT Adnexae: no masses, no tenderness left, no tenderness right   Musculoskeletal: Normal range of motion.  Neurological: She is alert and oriented to person, place, and time.  Skin: Skin is warm and dry.  Psychiatric: She has a normal mood and affect.    MAU Course  Procedures   MDM 2010-Report and transfer of care given to Dakisha Schoof, NP. Julianne Handler, CNM 02/24/16  Assessment and Plan   A:  1. Abnormal vaginal bleeding     P:  Discharge home in stable condition Bleeding precautions Rx sprintec Call the [facility] for follow up as needed  Lezlie Lye, NP 02/24/2016 8:35 PM

## 2016-02-24 NOTE — MAU Note (Signed)
Yesterday when she got home, she had some light spotting. Has 2 dose of depo, last dose was ? Feb.  Last period started 10/20- lasted 7 days. Started having abd pains. Last night.  Felt worse this morning, had a lot of blood this morning. Bleeding and pains have continued.

## 2016-02-26 LAB — GC/CHLAMYDIA PROBE AMP (~~LOC~~) NOT AT ARMC
Chlamydia: NEGATIVE
Neisseria Gonorrhea: NEGATIVE

## 2016-04-22 NOTE — L&D Delivery Note (Signed)
Patient is 21 y.o. G2P1001 426w6d admitted for SROM. Augmentation of labor by Pitocin.    Delivery Note At 1:28 AM a viable female was delivered via Vaginal, Spontaneous Delivery (Presentation:cephalic;LOA  ).  APGAR:8 , 9; weight pending  .   Placenta status: intact ,spontaneous .  Cord:  3 vessel   Anesthesia:  none Episiotomy:  none Lacerations: Periurethral Suture Repair: 2.0 vicryl Est. Blood Loss (mL): 1000  Mom to postpartum.  Baby to Couplet care / Skin to Skin.   Upon arrival, EFM showed fetal heart rate to 50s bpm. I placed FSE and heart rate improved to 115 bpm with position change. At this time, patient still had anterior cervical lip and was +2 station. She pushed with good maternal effort to deliver a viable female infant in cephalic, LOA position over intact perineum. No nuchal cord present. Anterior shoulder delivered easily. Baby was noted to have good tone and place on maternal abdomen for oral suctioning, drying and stimulation. Delayed cord clamping performed. Placenta delivered spontaneously with gentle cord traction. She had steady flow of bleeding and fundus was not firm with pitocin. Given cytotec 800 mcg rectally one time.  Fundus firm with massage and bleeding improved significantly. Perineum inspected and found to have right periurethral laceration, which was repaired with 2-0 vicryl with good hemostasis achieved. Counts of sharps, instruments, and lap pads were all correct.  Dr. Macon LargeAnyanwu was at bedside during entire delivery.    Rolm BookbinderAmber Hubbert Landrigan, DO [facility]

## 2016-06-20 ENCOUNTER — Emergency Department (HOSPITAL_COMMUNITY)
Admission: EM | Admit: 2016-06-20 | Discharge: 2016-06-20 | Disposition: A | Discharge status: Home / Self Care | Payer: Medicaid Other | Attending: Emergency Medicine | Admitting: Emergency Medicine

## 2016-06-20 ENCOUNTER — Encounter (HOSPITAL_COMMUNITY): Payer: Self-pay

## 2016-06-20 DIAGNOSIS — Z79899 Other long term (current) drug therapy: Secondary | ICD-10-CM | POA: Insufficient documentation

## 2016-06-20 DIAGNOSIS — R197 Diarrhea, unspecified: Secondary | ICD-10-CM | POA: Insufficient documentation

## 2016-06-20 DIAGNOSIS — O9989 Other specified diseases and conditions complicating pregnancy, childbirth and the puerperium: Secondary | ICD-10-CM | POA: Insufficient documentation

## 2016-06-20 DIAGNOSIS — R112 Nausea with vomiting, unspecified: Secondary | ICD-10-CM

## 2016-06-20 DIAGNOSIS — Z3A01 Less than 8 weeks gestation of pregnancy: Secondary | ICD-10-CM | POA: Insufficient documentation

## 2016-06-20 DIAGNOSIS — J45909 Unspecified asthma, uncomplicated: Secondary | ICD-10-CM | POA: Insufficient documentation

## 2016-06-20 DIAGNOSIS — O219 Vomiting of pregnancy, unspecified: Secondary | ICD-10-CM | POA: Insufficient documentation

## 2016-06-20 LAB — LIPASE, BLOOD: Lipase: 29 U/L (ref 11–51)

## 2016-06-20 LAB — COMPREHENSIVE METABOLIC PANEL
ALK PHOS: 61 U/L (ref 38–126)
ALT: 14 U/L (ref 14–54)
AST: 19 U/L (ref 15–41)
Albumin: 3.8 g/dL (ref 3.5–5.0)
Anion gap: 6 (ref 5–15)
BUN: 12 mg/dL (ref 6–20)
CALCIUM: 8.9 mg/dL (ref 8.9–10.3)
CO2: 22 mmol/L (ref 22–32)
Chloride: 108 mmol/L (ref 101–111)
Creatinine, Ser: 0.78 mg/dL (ref 0.44–1.00)
GFR calc non Af Amer: >60 mL/min (ref >60)
GLUCOSE: 78 mg/dL (ref 65–99)
Potassium: 4 mmol/L (ref 3.5–5.1)
SODIUM: 136 mmol/L (ref 135–145)
Total Bilirubin: 0.6 mg/dL (ref 0.3–1.2)
Total Protein: 6.9 g/dL (ref 6.5–8.1)

## 2016-06-20 LAB — CBC
HCT: 38.7 % (ref 36.0–46.0)
Hemoglobin: 12.3 g/dL (ref 12.0–15.0)
MCH: 27.3 pg (ref 26.0–34.0)
MCHC: 31.8 g/dL (ref 30.0–36.0)
MCV: 85.8 fL (ref 78.0–100.0)
PLATELETS: 405 10*3/uL — AB (ref 150–400)
RBC: 4.51 MIL/uL (ref 3.87–5.11)
RDW: 13.2 % (ref 11.5–15.5)
WBC: 11.4 10*3/uL — ABNORMAL HIGH (ref 4.0–10.5)

## 2016-06-20 LAB — I-STAT BETA HCG BLOOD, ED (MC, WL, AP ONLY): HCG, QUANTITATIVE: 274.5 m[IU]/mL — AB (ref <5)

## 2016-06-20 MED ORDER — PROMETHAZINE HCL 25 MG PO TABS: 25.0000 mg | ORAL_TABLET | Freq: Four times a day (QID) | ORAL | 0 refills | Status: DC | PRN

## 2016-06-20 NOTE — ED Triage Notes (Signed)
Per Pt, Pt is coming from home with complaints of emesis and nausea for a couple weeks. Complains of diarrhea that started today. Requesting a pregnancy test. Last period the first week of February.

## 2016-06-20 NOTE — Discharge Instructions (Signed)
[facility] Take Phenergan for nausea as needed Return for worsening symptoms

## 2016-06-20 NOTE — ED Provider Notes (Signed)
[facility]-EM[facility]ENCY DEPT Provider Note   CSN: [id number] Arrival date & time: 06/20/16  1245     History   Chief Complaint Chief Complaint  Patient presents with  . Emesis    HPI Rebecca Eaton is a 21 y.o. female who presents with nausea and vomiting for the past couple weeks. No significant past medical history. She states that she has felt very nauseous especially with certain smells. She has a couple episodes of nonbloody emesis. Her last menstrual period was the beginning of February. She had one episode of diarrhea today. She denies any fever, chills, chest pain, shortness of breath, abdominal pain, pelvic pain, urinary symptoms, vaginal discharge or bleeding. She has 1 prior uncomplicated pregnancy (a 24 year old daughter). She is new to the area and does not have an OB/GYN.  HPI  Past Medical History:  Diagnosis Date  . Asthma     There are no active problems to display for this patient.   Past Surgical History:  Procedure Laterality Date  . NO PAST SURGERIES      OB History    Gravida Para Term Preterm AB Living   1 1 1     1    SAB TAB Ectopic Multiple Live Births           1       Home Medications    Prior to Admission medications   Medication Sig Start Date End Date Taking? Authorizing Provider  norgestimate-ethinyl estradiol (ORTHO-CYCLEN,SPRINTEC,PREVIFEM) 0.25-35 MG-MCG tablet Take 1 tablet by mouth daily. 02/24/16  Yes Duane Lope, NP  promethazine (PHENERGAN) 25 MG tablet Take 1 tablet (25 mg total) by mouth every 6 (six) hours as needed for nausea or vomiting. 06/20/16   Bethel Born, PA-C    Family History No family history on file.  Social History Social History  Substance Use Topics  . Smoking status: Never Smoker  . Smokeless tobacco: Never Used  . Alcohol use No     Allergies   Patient has no known allergies.   Review of Systems Review of Systems  Constitutional: Negative for chills and fever.  Respiratory: Negative for  shortness of breath.   Cardiovascular: Negative for chest pain.  Gastrointestinal: Positive for diarrhea, nausea and vomiting. Negative for abdominal pain.  Genitourinary: Negative for dysuria, frequency, pelvic pain, urgency, vaginal bleeding and vaginal discharge.     Physical Exam Updated Vital Signs BP 124/79 (BP Location: Right Arm)   Pulse 82   Temp 99.1 F (37.3 C) (Oral)   Resp 16   Ht 5\' 3"  (1.6 m)   Wt 64.9 kg   LMP 05/23/2016   SpO2 100%   BMI 25.33 kg/m   Physical Exam  Constitutional: She is oriented to person, place, and time. She appears well-developed and well-nourished. No distress.  NAD, happy, texting on phone during exam  HENT:  Head: Normocephalic and atraumatic.  Eyes: Conjunctivae are normal. Pupils are equal, round, and reactive to light. Right eye exhibits no discharge. Left eye exhibits no discharge. No scleral icterus.  Neck: Normal range of motion.  Cardiovascular: Normal rate and regular rhythm.  Exam reveals no gallop and no friction rub.   No murmur heard. Pulmonary/Chest: Effort normal and breath sounds normal. No respiratory distress. She has no wheezes. She has no rales. She exhibits no tenderness.  Abdominal: Soft. Bowel sounds are normal. She exhibits no distension and no mass. There is no tenderness. There is no rebound and no guarding. No hernia.  Neurological: She is alert and oriented to person, place, and time.  Skin: Skin is warm and dry.  Psychiatric: She has a normal mood and affect. Her behavior is normal.  Nursing note and vitals reviewed.    ED Treatments / Results  Labs (all labs ordered are listed, but only abnormal results are displayed) Labs Reviewed  CBC - Abnormal; Notable for the following:       Result Value   WBC 11.4 (*)    Platelets 405 (*)    All other components within normal limits  I-STAT BETA HCG BLOOD, ED ([facility], [facility], [facility] ONLY) - Abnormal; Notable for the following:    I-stat hCG, quantitative 274.5 (*)     All other components within normal limits  LIPASE, BLOOD  COMPREHENSIVE METABOLIC PANEL  URINALYSIS, ROUTINE W REFLEX MICROSCOPIC    EKG  EKG Interpretation None       Radiology No results found.  Procedures Procedures (including critical care time)  Medications Ordered in ED Medications - No data to display   Initial Impression / Assessment and Plan / ED Course  I have reviewed the triage vital signs and the nursing notes.  Pertinent labs & imaging results that were available during my care of the patient were reviewed by me and considered in my medical decision making (see chart for details).  21 year old female with positive pregnancy test. Vitals are normal. Hcg is 274.5 which is consistent with history. CBC and CMP are unremarkable. She has no urinary symptoms. Will provide rx for antiemetic and refer to OBGYN for prenatal care. She verbalized understanding. Return precautions given.  Final Clinical Impressions(s) / ED Diagnoses   Final diagnoses:  Less than [redacted] weeks gestation of pregnancy  Nausea vomiting and diarrhea    New Prescriptions Discharge Medication List as of 06/20/2016  3:22 PM    START taking these medications   Details  promethazine (PHENERGAN) 25 MG tablet Take 1 tablet (25 mg total) by mouth every 6 (six) hours as needed for nausea or vomiting., Starting Thu 06/20/2016, Print         Bethel BornKelly Marie Cohen Doleman, PA-C 06/20/16 1638    Linwood DibblesJon Knapp, MD 06/23/16 [phone]

## 2016-06-25 ENCOUNTER — Ambulatory Visit: Payer: Medicaid Other

## 2016-08-07 ENCOUNTER — Encounter: Payer: Self-pay | Admitting: Obstetrics and Gynecology

## 2016-08-07 ENCOUNTER — Ambulatory Visit (INDEPENDENT_AMBULATORY_CARE_PROVIDER_SITE_OTHER): Payer: Self-pay | Admitting: Obstetrics and Gynecology

## 2016-08-07 VITALS — BP 110/76 | HR 72 | Wt 151.0 lb

## 2016-08-07 DIAGNOSIS — Z124 Encounter for screening for malignant neoplasm of cervix: Secondary | ICD-10-CM

## 2016-08-07 DIAGNOSIS — Z3480 Encounter for supervision of other normal pregnancy, unspecified trimester: Secondary | ICD-10-CM

## 2016-08-07 DIAGNOSIS — Z23 Encounter for immunization: Secondary | ICD-10-CM

## 2016-08-07 DIAGNOSIS — Z3483 Encounter for supervision of other normal pregnancy, third trimester: Secondary | ICD-10-CM

## 2016-08-07 DIAGNOSIS — Z3493 Encounter for supervision of normal pregnancy, unspecified, third trimester: Secondary | ICD-10-CM

## 2016-08-07 DIAGNOSIS — Z113 Encounter for screening for infections with a predominantly sexual mode of transmission: Secondary | ICD-10-CM

## 2016-08-07 NOTE — Progress Notes (Signed)
New ob packet given Baby scripts app  FLU SHOT TODAY

## 2016-08-07 NOTE — Progress Notes (Signed)
New OB Note  08/07/2016   Clinic: [facility]  Chief Complaint: NOB  Transfer of Care Patient: no  History of Present Illness: Ms. Gundry is a 21 y.o. G2P1001 @ 10/6 weeks (EDC 11/8, based on Patient's last menstrual period was 05/23/2016.).  Preg complicated by has Supervision of other normal pregnancy, antepartum on her problem list.   Any events prior to today's visit: no Her periods were: regular, qmonth She was using no method when she conceived. Used depo about 75m prior to pregnancy She has Positive signs or symptoms of nausea/vomiting of pregnancy. Getting better and phenergan helps She has Negative signs or symptoms of miscarriage or preterm labor On any different medications around the time she conceived/early pregnancy: No   ROS: A 12-point review of systems was performed and negative, except as stated in the above HPI.  OBGYN History: As per HPI. OB History  Gravida Para Term Preterm AB Living  SAB TAB Ectopic Multiple Live Births          1    # Outcome Date GA Lbr Len/2nd Weight Sex Delivery Anes PTL Lv  2 Current           1 Term 07/21/12     Vag-Spont       Obstetric Comments  G1: 7lbs 8oz. No issues.    Any issues with any prior pregnancies: no Prior children are healthy, doing well, and without any problems or issues: yes History of pap smears: Yes. Last pap smear unknown and results were negative, per patient  Past Medical History: Past Medical History:  Diagnosis Date  . Asthma     Past Surgical History: Past Surgical History:  Procedure Laterality Date  . NO PAST SURGERIES      Family History:  Family History  Problem Relation Age of Onset  . Heart disease Mother   . Heart disease Father    She denies any history of mental retardation, birth defects or genetic disorders in her or the FOB's history  Social History:  Social History   Social History  . Marital status: Single    Spouse name: N/A  .  Number of children: N/A  . Years of education: N/A   Occupational History  . Not on file.   Social History Main Topics  . Smoking status: Never Smoker  . Smokeless tobacco: Never Used  . Alcohol use No  . Drug use: No  . Sexual activity: Yes   Other Topics Concern  . Not on file   Social History Narrative  . No narrative on file   Works at [employer]  Allergy: No Known Allergies  Health Maintenance:  Mammogram Up to Date: not applicable  Current Outpatient Medications: PNV, phenergan 25 prn  Physical Exam:   BP 110/76   Pulse 72   Wt 151 lb (68.5 kg)   LMP 05/23/2016   BMI 26.75 kg/m  Body mass index is 26.75 kg/m. Fundal height: not applicable FHTs: 160s  General appearance: Well nourished, well developed female in no acute distress.  Neck:  Supple, normal appearance, and no thyromegaly  Cardiovascular: S1, S2 normal, no murmur, rub or gallop, regular rate and rhythm Respiratory:  Clear to auscultation bilateral. Normal respiratory effort Abdomen: positive bowel sounds and no masses, hernias; diffusely non tender to palpation, non distended Neuro/Psych:  Normal mood and affect.  Skin:  Warm and dry.  Lymphatic:  No inguinal lymphadenopathy.  Pelvic exam: is not limited by body habitus EGBUS: within normal limits, Vagina: within normal limits and with no blood in the vault, Cervix: normal appearing cervix without discharge or lesions, closed/long/high, Uterus:  enlarged, c/w 10-12 week size, and Adnexa:  normal adnexa and no mass, fullness, tenderness  Laboratory: Normal 3/1 ER cbc, cmp, lipase  Imaging:  None  Assessment: pt doing well  Plan: 1. Encounter for supervision of normal pregnancy in third trimester, unspecified gravidity Routine care. Set EDC after NT scan. Pt okay for baby scripts. Told to do 12.5 phenergan prn instead of 25 to help with somnolence.  - Prenatal Profile I - HIV antibody - Hemoglobinopathy Evaluation - Culture, OB  Urine - Flu Vaccine QUAD 36+ mos IM (Fluarix, Quad PF) - Cytology - PAP - SMN1 Copy Number Analysis - Cystic fibrosis diagnostic study - [place] MFM Fetal Nuchal Translucency; Future  Problem list reviewed and updated.  Follow up in 4-5 weeks.  The nature of [facility] with multiple MDs and other Advanced Practice Providers was explained to patient; also emphasized that residents, students are part of our team.  >50% of 20 min visit spent on counseling and coordination of care.     Cornelia Copa MD Attending Center for [facility])

## 2016-08-09 LAB — CYTOLOGY - PAP
Chlamydia: NEGATIVE
Diagnosis: UNDETERMINED — AB
HPV: DETECTED — AB
NEISSERIA GONORRHEA: NEGATIVE

## 2016-08-11 LAB — URINE CULTURE, OB REFLEX

## 2016-08-11 LAB — CULTURE, OB URINE

## 2016-08-13 ENCOUNTER — Encounter: Payer: Self-pay | Admitting: Obstetrics and Gynecology

## 2016-08-13 DIAGNOSIS — N879 Dysplasia of cervix uteri, unspecified: Secondary | ICD-10-CM | POA: Insufficient documentation

## 2016-08-15 LAB — HEMOGLOBINOPATHY EVALUATION
Ferritin: 29 ng/mL (ref 15–150)
HGB A: 97.7 % (ref 96.4–98.8)
HGB S: 0 %
HGB SOLUBILITY: NEGATIVE
HGB VARIANT: 0 %
Hgb A2 Quant: 2.3 % (ref 1.8–3.2)
Hgb C: 0 %
Hgb F Quant: 0 % (ref 0.0–2.0)

## 2016-08-15 LAB — SMN1 COPY NUMBER ANALYSIS (SMA CARRIER SCREENING)

## 2016-08-15 LAB — PRENATAL PROFILE I(LABCORP)
Antibody Screen: NEGATIVE
BASOS ABS: 0.1 10*3/uL (ref 0.0–0.2)
Basos: 0 %
EOS (ABSOLUTE): 0.2 10*3/uL (ref 0.0–0.4)
EOS: 2 %
HEMATOCRIT: 35.8 % (ref 34.0–46.6)
HEP B S AG: NEGATIVE
Hemoglobin: 11.6 g/dL (ref 11.1–15.9)
Immature Grans (Abs): 0.1 10*3/uL (ref 0.0–0.1)
Immature Granulocytes: 1 %
Lymphocytes Absolute: 2 10*3/uL (ref 0.7–3.1)
Lymphs: 17 %
MCH: 28.4 pg (ref 26.6–33.0)
MCHC: 32.4 g/dL (ref 31.5–35.7)
MCV: 88 fL (ref 79–97)
MONOCYTES: 8 %
Monocytes Absolute: 0.9 10*3/uL (ref 0.1–0.9)
Neutrophils Absolute: 8.5 10*3/uL — ABNORMAL HIGH (ref 1.4–7.0)
Neutrophils: 72 %
PLATELETS: 395 10*3/uL — AB (ref 150–379)
RBC: 4.09 x10E6/uL (ref 3.77–5.28)
RDW: 14.1 % (ref 12.3–15.4)
RPR: NONREACTIVE
RUBELLA: 2.87 {index} (ref >0.99)
Rh Factor: POSITIVE
WBC: 11.8 10*3/uL — AB (ref 3.4–10.8)

## 2016-08-15 LAB — HIV ANTIBODY (ROUTINE TESTING W REFLEX): HIV Screen 4th Generation wRfx: NONREACTIVE

## 2016-08-26 ENCOUNTER — Ambulatory Visit (HOSPITAL_COMMUNITY)
Admission: RE | Admit: 2016-08-26 | Discharge: 2016-08-26 | Disposition: A | Discharge status: Home / Self Care | Payer: Self-pay | Source: Ambulatory Visit | Attending: Obstetrics and Gynecology | Admitting: Obstetrics and Gynecology

## 2016-08-26 ENCOUNTER — Encounter (HOSPITAL_COMMUNITY): Payer: Self-pay

## 2016-08-26 DIAGNOSIS — Z3682 Encounter for antenatal screening for nuchal translucency: Secondary | ICD-10-CM | POA: Insufficient documentation

## 2016-08-26 DIAGNOSIS — Z3493 Encounter for supervision of normal pregnancy, unspecified, third trimester: Secondary | ICD-10-CM | POA: Insufficient documentation

## 2016-08-28 ENCOUNTER — Other Ambulatory Visit: Payer: Self-pay

## 2016-09-02 ENCOUNTER — Encounter: Payer: Self-pay | Admitting: Obstetrics and Gynecology

## 2016-09-02 ENCOUNTER — Telehealth: Payer: Self-pay | Admitting: Obstetrics and Gynecology

## 2016-09-02 DIAGNOSIS — O289 Unspecified abnormal findings on antenatal screening of mother: Secondary | ICD-10-CM | POA: Insufficient documentation

## 2016-09-02 NOTE — Telephone Encounter (Signed)
OB Telephone Note  Patient called at [phone] to let her know about the +1st trimester screen for increasd DS risk. D/w her high FP rate with screening tests and that [facility] go over differing ways to f/u the screening tests. All questions asked and answered. inbasket message sent to clinic pool   Rogers Bingharlie Kamsiyochukwu Spickler, Montez HagemanJr MD Attending Center for [facility] 09/02/2016 Time: [phone]

## 2016-09-05 ENCOUNTER — Telehealth: Payer: Self-pay | Admitting: General Practice

## 2016-09-05 DIAGNOSIS — O289 Unspecified abnormal findings on antenatal screening of mother: Secondary | ICD-10-CM

## 2016-09-05 NOTE — Telephone Encounter (Signed)
-----   Message from [place] Bingharlie Pickens, MD sent at 09/02/2016  8:42 AM EDT ----- Please refer her to genetic counseling with MFM asap for elevated down syndrome risk on first trimester screen. thanks

## 2016-09-05 NOTE — Telephone Encounter (Signed)
appt made 5/22 @ 11am. Called patient, no answer- left message to call us back concerning an appt but we will send you a mychart message.

## 2016-09-10 ENCOUNTER — Other Ambulatory Visit (HOSPITAL_COMMUNITY): Payer: Self-pay | Admitting: *Deleted

## 2016-09-10 ENCOUNTER — Ambulatory Visit (HOSPITAL_COMMUNITY)
Admission: RE | Admit: 2016-09-10 | Discharge: 2016-09-10 | Disposition: A | Discharge status: Home / Self Care | Payer: Self-pay | Source: Ambulatory Visit | Attending: Obstetrics and Gynecology | Admitting: Obstetrics and Gynecology

## 2016-09-10 DIAGNOSIS — O289 Unspecified abnormal findings on antenatal screening of mother: Secondary | ICD-10-CM

## 2016-09-10 DIAGNOSIS — Z3A15 15 weeks gestation of pregnancy: Secondary | ICD-10-CM

## 2016-09-10 DIAGNOSIS — Z31430 Encounter of female for testing for genetic disease carrier status for procreative management: Secondary | ICD-10-CM | POA: Insufficient documentation

## 2016-09-10 NOTE — Progress Notes (Signed)
Genetic Counseling  High-Risk Gestation Note  Appointment Date:  09/10/2016 Referred By: [place] BingPickens, Charlie, MD Date of Birth:  1995-11-17 Partner:  Treacherous Walker   Pregnancy History: G2P1001 Estimated Date of Delivery: 02/26/17 Estimated Gestational Age: 6225w6d   Ms. Presley RaddleAntavia Baley and her partner, Mr. Demetrios Lollreacherous Walker, were seen for genetic counseling because of an increased risk for fetal Down syndrome based on First trimester screening.  In summary:  Reviewed results of screening test  Increased risk for Down syndrome  Discussed additional screening options  NIPS-performed today  Ultrasound- scheduled   Discussed diagnostic testing options  Amniocentesis-declined  Reviewed family history concerns  Discussed general population carrier screening options  CF and SMA-declined  Hemoglobinopathies- nl hemoglobin electrophoresis  They were counseled regarding the screening result and the associated 1 in 136 risk for fetal Down syndrome.  We reviewed chromosomes, nondisjunction, and the common features and variable prognosis of Down syndrome. In addition, we reviewed the screen adjusted reduction in risks for trisomies 13/18.  We also discussed other explanations for a screen positive result including: a gestational dating error, differences in maternal metabolism, and normal variation.  We reviewed other available screening options including noninvasive prenatal screening (NIPS)/cell free DNA (cfDNA) screening, and detailed ultrasound.  They were counseled that screening tests are used to modify a patient's a priori risk for aneuploidy, typically based on age. This estimate provides a pregnancy specific risk assessment. We reviewed the benefits and limitations of each option. Specifically, we discussed the conditions for which each test screens, the detection rates, and false positive rates of each. They were also counseled regarding diagnostic testing via amniocentesis. We  reviewed the approximate 1 in 300-500 risk for complications from amniocentesis, including spontaneous pregnancy loss. We discussed the possible results that the tests might provide including: positive, negative, unanticipated, and no result. Finally, they were counseled regarding the cost of each option and potential out of pocket expenses. After consideration of all the options, they elected to proceed with NIPS. Those results will be available in 5-7 days.    The patient also expressed interest in having a detailed ultrasound.  She will return in two weeks for this appointment. Diagnostic testing was declined today.  They understand that screening tests cannot rule out all birth defects or genetic syndromes. The patient was advised of this limitation and states she still does not want additional testing at this time.   Rebecca Eaton was provided with written information regarding cystic fibrosis (CF), spinal muscular atrophy (SMA) and hemoglobinopathies including the carrier frequency, availability of carrier screening and prenatal diagnosis if indicated.  In addition, we discussed that CF and hemoglobinopathies are routinely screened for as part of the [facility] newborn screening panel.  After further discussion, she declined screening for CF and SMA. She previously had hemoglobin electrophoresis, which was normal.    Both family histories were reviewed and found to be noncontributory for birth defects, intellectual disability, and known genetic conditions. Without further information regarding the provided family history, an accurate genetic risk cannot be calculated. Further genetic counseling is warranted if more information is obtained.  Rebecca Eaton denied exposure to environmental toxins or chemical agents. She denied the use of alcohol, tobacco or street drugs. She denied significant viral illnesses during the course of her pregnancy. Her medical and surgical histories were noncontributory.   I counseled  this couple for approximately 38 minutes regarding the above risks and available options.   Donald Prosehristy S. Girl Schissler, MS Certified Genetic Counselor

## 2016-09-15 ENCOUNTER — Encounter (HOSPITAL_COMMUNITY): Payer: Self-pay | Admitting: *Deleted

## 2016-09-15 ENCOUNTER — Inpatient Hospital Stay (HOSPITAL_COMMUNITY)
Admission: AD | Admit: 2016-09-15 | Discharge: 2016-09-15 | Disposition: A | Discharge status: Home / Self Care | Payer: Medicaid Other | Source: Ambulatory Visit | Attending: Obstetrics & Gynecology | Admitting: Obstetrics & Gynecology

## 2016-09-15 DIAGNOSIS — Z3A16 16 weeks gestation of pregnancy: Secondary | ICD-10-CM | POA: Insufficient documentation

## 2016-09-15 DIAGNOSIS — R109 Unspecified abdominal pain: Secondary | ICD-10-CM

## 2016-09-15 DIAGNOSIS — N949 Unspecified condition associated with female genital organs and menstrual cycle: Secondary | ICD-10-CM

## 2016-09-15 DIAGNOSIS — O9989 Other specified diseases and conditions complicating pregnancy, childbirth and the puerperium: Secondary | ICD-10-CM

## 2016-09-15 DIAGNOSIS — O26892 Other specified pregnancy related conditions, second trimester: Secondary | ICD-10-CM | POA: Diagnosis not present

## 2016-09-15 LAB — URINALYSIS, ROUTINE W REFLEX MICROSCOPIC
BILIRUBIN URINE: NEGATIVE
Glucose, UA: NEGATIVE mg/dL
Ketones, ur: 15 mg/dL — AB
Leukocytes, UA: NEGATIVE
NITRITE: NEGATIVE
PH: 6.5 (ref 5.0–8.0)
Protein, ur: 30 mg/dL — AB
SPECIFIC GRAVITY, URINE: 1.015 (ref 1.005–1.030)

## 2016-09-15 LAB — URINALYSIS, MICROSCOPIC (REFLEX)

## 2016-09-15 NOTE — MAU Provider Note (Signed)
None     Chief Complaint:  Abdominal Cramping   Rebecca Eaton is  21 y.o. G2P1001 at 5150w4d presents complaining of Abdominal Cramping C/o intermittent abdominal cramping for past 1.5 weeks; cramping is felt along the sides and across the bottom of the abdomen; has to do a lot of bending and lifting at work; denies any discharge or spotting; dopplered FHR.  Denies VB or abnormal discharge.    Obstetrical/Gynecological History: OB History    Gravida Para Term Preterm AB Living   2 1 1     1    SAB TAB Ectopic Multiple Live Births           1      Obstetric Comments   G1: 7lbs 8oz. No issues.      Past Medical History: Past Medical History:  Diagnosis Date  . Asthma     Past Surgical History: Past Surgical History:  Procedure Laterality Date  . NO PAST SURGERIES      Family History: Family History  Problem Relation Age of Onset  . Heart disease Mother   . Heart disease Father     Social History: Social History  Substance Use Topics  . Smoking status: Never Smoker  . Smokeless tobacco: Never Used  . Alcohol use No    Allergies:  Allergies  Allergen Reactions  . Peanut-Containing Drug Products Anaphylaxis    Meds:  Prescriptions Prior to Admission  Medication Sig Dispense Refill Last Dose  . loratadine (CLARITIN) 10 MG tablet Take 10 mg by mouth daily as needed for allergies.   Past Week at Unknown time  . Prenatal Vit-Fe Fumarate-FA (PRENATAL MULTIVITAMIN) TABS tablet Take 2 tablets by mouth daily at 12 noon.   09/14/2016 at Unknown time  . norgestimate-ethinyl estradiol (ORTHO-CYCLEN,SPRINTEC,PREVIFEM) 0.25-35 MG-MCG tablet Take 1 tablet by mouth daily. (Patient not taking: Reported on 08/07/2016) 1 Package 3 Not Taking  . promethazine (PHENERGAN) 25 MG tablet Take 1 tablet (25 mg total) by mouth every 6 (six) hours as needed for nausea or vomiting. (Patient not taking: Reported on 08/07/2016) 30 tablet 0 Not Taking    Review of Systems   Constitutional:  Negative for fever and chills Eyes: Negative for visual disturbances Respiratory: Negative for shortness of breath, dyspnea Cardiovascular: Negative for chest pain or palpitations  Gastrointestinal: Negative for vomiting, diarrhea and constipation Genitourinary: Negative for dysuria and urgency Musculoskeletal: Negative for back pain, joint pain, myalgias.  Normal ROM  Neurological: Negative for dizziness and headaches    Physical Exam  Blood pressure (!) 116/47, pulse (!) 102, temperature 98.1 F (36.7 C), resp. rate 18, height 5\' 3"  (1.6 m), weight 68.9 kg (152 lb), last menstrual period 05/22/2016, SpO2 100 %. GENERAL: Well-developed, well-nourished female in no acute distress.  LUNGS: Clear to auscultation bilaterally.  HEART: Regular rate and rhythm. ABDOMEN: Soft, nontender, nondistended EXTREMITIES: Nontender, no edema, 2+ distal pulses. DTR's 2+   PELVIC:  Normal appearing discharge, cx LTC  Labs: Results for orders placed or performed during the hospital encounter of 09/15/16 (from the past 24 hour(s))  Urinalysis, Routine w reflex microscopic   Collection Time: 09/15/16  3:50 PM  Result Value Ref Range   Color, Urine YELLOW YELLOW   APPearance CLEAR CLEAR   Specific Gravity, Urine 1.015 1.005 - 1.030   pH 6.5 5.0 - 8.0   Glucose, UA NEGATIVE NEGATIVE mg/dL   Hgb urine dipstick TRACE (A) NEGATIVE   Bilirubin Urine NEGATIVE NEGATIVE   Ketones, ur 15 (A) NEGATIVE  mg/dL   Protein, ur 30 (A) NEGATIVE mg/dL   Nitrite NEGATIVE NEGATIVE   Leukocytes, UA NEGATIVE NEGATIVE  Urinalysis, Microscopic (reflex)   Collection Time: 09/15/16  3:50 PM  Result Value Ref Range   RBC / HPF 0-5 0 - 5 RBC/hpf   WBC, UA 0-5 0 - 5 WBC/hpf   Bacteria, UA MANY (A) NONE SEEN   Squamous Epithelial / LPF 6-30 (A) NONE SEEN   Hyphae Yeast PRESENT    Imaging Studies:    Assessment: Rebecca Eaton is  21 y.o. G2P1001 at [redacted]w[redacted]d presents with intermittent lower abdominal cramping/pain at [redacted]  weeks pregnant in the area of the round ligaments,  No evidence of threatened AB, infection, or dehydration.  Most likely normal  Plan: May try extra abdominal support (maternity belt), stay overly hydrated, warm baths/shower prn.  Will culture urine  CRESENZO-DISHMAN,Belem Hintze 5/27/20184:54 PM

## 2016-09-15 NOTE — MAU Note (Signed)
Pt presents to MAU with complaints of lower abdominal cramping that started a few weeks ago. Denies any VB or abnormal discharge

## 2016-09-15 NOTE — MAU Note (Signed)
C/o intermittent abdominal cramping for past 1.5 weeks; has to do a lot of bending and lifting at work; denies any discharge or spotting; dopplered FHR;

## 2016-09-15 NOTE — Discharge Instructions (Signed)
Round Ligament Pain The round ligament is a cord of muscle and tissue that helps to support the uterus. It can become a source of pain during pregnancy if it becomes stretched or twisted as the baby grows. The pain usually begins in the second trimester of pregnancy, and it can come and go until the baby is delivered. It is not a serious problem, and it does not cause harm to the baby. Round ligament pain is usually a short, sharp, and pinching pain, but it can also be a dull, lingering, and aching pain. The pain is felt in the lower side of the abdomen or in the groin. It usually starts deep in the groin and moves up to the outside of the hip area. Pain can occur with:  A sudden change in position.  Rolling over in bed.  Coughing or sneezing.  Physical activity. Follow these instructions at home: Watch your condition for any changes. Take these steps to help with your pain:  When the pain starts, relax. Then try:  Sitting down.  Flexing your knees up to your abdomen.  Lying on your side with one pillow under your abdomen and another pillow between your legs.  Sitting in a warm bath for 15-20 minutes or until the pain goes away.  Take over-the-counter and prescription medicines only as told by your health care provider.  Move slowly when you sit and stand.  Avoid long walks if they cause pain.  Stop or lessen your physical activities if they cause pain. Contact a health care provider if:  Your pain does not go away with treatment.  You feel pain in your back that you did not have before.  Your medicine is not helping. Get help right away if:  You develop a fever or chills.  You develop uterine contractions.  You develop vaginal bleeding.  You develop nausea or vomiting.  You develop diarrhea.  You have pain when you urinate. This information is not intended to replace advice given to you by your health care provider. Make sure you discuss any questions you have with  your health care provider. Document Released: 01/16/2008 Document Revised: 09/14/2015 Document Reviewed: 06/15/2014 Elsevier Interactive Patient Education  2017 [employer] Inc[employer]  

## 2016-09-16 ENCOUNTER — Other Ambulatory Visit: Payer: Self-pay

## 2016-09-17 ENCOUNTER — Telehealth (HOSPITAL_COMMUNITY): Payer: Self-pay

## 2016-09-17 LAB — URINE CULTURE: SPECIAL REQUESTS: NORMAL

## 2016-09-17 NOTE — Telephone Encounter (Signed)
Rebecca Eaton returned my call to discuss her prenatal cell free DNA test results.  Mrs. Rebecca Eaton had Panorama testing through [facility].  The patient was identified by name and DOB.  We reviewed that these are within normal limits, showing a less than 1 in 10,000 risk for trisomies 21, 18 and 13, and monosomy X (Turner syndrome).  In addition, the risk for triploidy and sex chromosome trisomies (47,XXX and 47,XXY) was also low risk. We reviewed that this testing identifies > 99% of pregnancies with trisomy 8521, trisomy 213, sex chromosome trisomies (47,XXX and 47,XXY), and triploidy. The detection rate for trisomy 18 is 96%. The detection rate for monosomy X is ~92%.  The false positive rate is <0.1% for all conditions. Testing was also consistent with female fetal sex.  The patient did wish to know fetal sex.  She understands that this testing does not identify all genetic conditions.  All questions were answered to her satisfaction, she was encouraged to call with additional questions or concerns.  Donald Prosehristy S. Keylie Beavers, MS Certified Genetic Counselor

## 2016-09-17 NOTE — Telephone Encounter (Signed)
Called patient to discuss her noninvasive prenatal screening (Panorama) results. Left patient a VM asking her to return my call. Donald Prosehristy S. Camdynn Maranto, MS CGC

## 2016-09-19 ENCOUNTER — Ambulatory Visit (INDEPENDENT_AMBULATORY_CARE_PROVIDER_SITE_OTHER): Payer: Self-pay | Admitting: Obstetrics and Gynecology

## 2016-09-19 VITALS — BP 104/71 | HR 87 | Wt 153.1 lb

## 2016-09-19 DIAGNOSIS — Z3480 Encounter for supervision of other normal pregnancy, unspecified trimester: Secondary | ICD-10-CM

## 2016-09-19 DIAGNOSIS — O289 Unspecified abnormal findings on antenatal screening of mother: Secondary | ICD-10-CM

## 2016-09-19 DIAGNOSIS — Z3482 Encounter for supervision of other normal pregnancy, second trimester: Secondary | ICD-10-CM

## 2016-09-19 NOTE — Progress Notes (Signed)
Prenatal Visit Note Date: 09/19/2016 Clinic: [facility]  Subjective:  Rebecca Eaton is a 21 y.o. G2P1001 at 2330w1d being seen today for ongoing prenatal care.  She is currently monitored for the following issues for this low-risk pregnancy and has Supervision of other normal pregnancy, antepartum; Cervical dysplasia; and Abnormal first trimester screen on her problem list.  Patient reports no complaints.   Contractions: Not present. Vag. Bleeding: None.  Movement: Present. Denies leaking of fluid.   The following portions of the patient's history were reviewed and updated as appropriate: allergies, current medications, past family history, past medical history, past social history, past surgical history and problem list. Problem list updated.  Objective:   Vitals:   09/19/16 1245  BP: 104/71  Pulse: 87  Weight: 153 lb 1.6 oz (69.4 kg)    Fetal Status: Fetal Heart Rate (bpm): 150   Movement: Present     General:  Alert, oriented and cooperative. Patient is in no acute distress.  Skin: Skin is warm and dry. No rash noted.   Cardiovascular: Normal heart rate noted  Respiratory: Normal respiratory effort, no problems with respiration noted  Abdomen: Soft, gravid, appropriate for gestational age. Pain/Pressure: Present     Pelvic:  Cervical exam deferred        Extremities: Normal range of motion.  Edema: None  Mental Status: Normal mood and affect. Normal behavior. Normal judgment and thought content.   Urinalysis:      Assessment and Plan:  Pregnancy: G2P1001 at 6830w1d  1. Abnormal first trimester screen S/p GC and low risk panorama. Pt amenable to afp today - AFP, Serum, Open Spina Bifida  2. Supervision of other normal pregnancy, antepartum Routine care. Anatomy u/s already scheduled.  Preterm labor symptoms and general obstetric precautions including but not limited to vaginal bleeding, contractions, leaking of fluid and fetal movement were reviewed in  detail with the patient. Please refer to After Visit Summary for other counseling recommendations.  Return in about 4 weeks (around 10/17/2016).   Ponce BingPickens, Namine Beahm, MD

## 2016-09-19 NOTE — Progress Notes (Signed)
Reported to pt pap smear results and the recommendation of rpt pap smear in 1 year.  Pt stated understanding.

## 2016-09-24 ENCOUNTER — Other Ambulatory Visit (HOSPITAL_COMMUNITY): Payer: Self-pay | Admitting: Obstetrics and Gynecology

## 2016-09-24 ENCOUNTER — Ambulatory Visit (HOSPITAL_COMMUNITY)
Admission: RE | Admit: 2016-09-24 | Discharge: 2016-09-24 | Disposition: A | Discharge status: Home / Self Care | Payer: Self-pay | Source: Ambulatory Visit | Attending: Obstetrics and Gynecology | Admitting: Obstetrics and Gynecology

## 2016-09-24 ENCOUNTER — Encounter (HOSPITAL_COMMUNITY): Payer: Self-pay

## 2016-09-24 DIAGNOSIS — Z3A17 17 weeks gestation of pregnancy: Secondary | ICD-10-CM | POA: Insufficient documentation

## 2016-09-24 DIAGNOSIS — O289 Unspecified abnormal findings on antenatal screening of mother: Secondary | ICD-10-CM

## 2016-09-24 DIAGNOSIS — Z363 Encounter for antenatal screening for malformations: Secondary | ICD-10-CM

## 2016-09-24 LAB — AFP, SERUM, OPEN SPINA BIFIDA
AFP MOM: 1.01
AFP Value: 41.5 ng/mL
GEST. AGE ON COLLECTION DATE: 17.1 wk
MATERNAL AGE AT EDD: 23.7 a
OSBR Risk 1 IN: 10000
TEST RESULTS AFP: NEGATIVE
Weight: 153 [lb_av]

## 2016-10-16 ENCOUNTER — Encounter: Payer: Self-pay | Admitting: Student

## 2016-10-17 ENCOUNTER — Encounter: Payer: Self-pay | Admitting: Obstetrics & Gynecology

## 2016-10-22 ENCOUNTER — Encounter: Payer: Self-pay | Admitting: Advanced Practice Midwife

## 2016-11-11 ENCOUNTER — Ambulatory Visit (INDEPENDENT_AMBULATORY_CARE_PROVIDER_SITE_OTHER): Payer: Self-pay | Admitting: Certified Nurse Midwife

## 2016-11-11 ENCOUNTER — Encounter: Payer: Self-pay | Admitting: General Practice

## 2016-11-11 VITALS — BP 114/64 | HR 91 | Wt 163.5 lb

## 2016-11-11 DIAGNOSIS — O289 Unspecified abnormal findings on antenatal screening of mother: Secondary | ICD-10-CM

## 2016-11-11 DIAGNOSIS — Z3482 Encounter for supervision of other normal pregnancy, second trimester: Secondary | ICD-10-CM

## 2016-11-11 DIAGNOSIS — Z3480 Encounter for supervision of other normal pregnancy, unspecified trimester: Secondary | ICD-10-CM

## 2016-11-11 NOTE — Progress Notes (Signed)
Subjective:  Rebecca Eaton is a 21 y.o. G2P1001 at 4815w5d being seen today for ongoing prenatal care.  She is currently monitored for the following issues for this low-risk pregnancy and has Supervision of other normal pregnancy, antepartum; Cervical dysplasia; and Abnormal first trimester screen on her problem list.  Patient reports no complaints.  Contractions: Not present. Vag. Bleeding: None.  Movement: Present. Denies leaking of fluid.   The following portions of the patient's history were reviewed and updated as appropriate: allergies, current medications, past family history, past medical history, past social history, past surgical history and problem list. Problem list updated.  Objective:   Vitals:   11/11/16 1438  BP: 114/64  Pulse: 91  Weight: 163 lb 8 oz (74.2 kg)    Fetal Status: Fetal Heart Rate (bpm): 135   Movement: Present     General:  Alert, oriented and cooperative. Patient is in no acute distress.  Skin: Skin is warm and dry. No rash noted.   Cardiovascular: Normal heart rate noted  Respiratory: Normal respiratory effort, no problems with respiration noted  Abdomen: Soft, gravid, appropriate for gestational age. Pain/Pressure: Present     Pelvic: Vag. Bleeding: None     Cervical exam deferred        Extremities: Normal range of motion.  Edema: None  Mental Status: Normal mood and affect. Normal behavior. Normal judgment and thought content.    Assessment and Plan:  Pregnancy: G2P1001 at 2615w5d  1. Supervision of other normal pregnancy, antepartum Continue routine prenatal care.   2. Abnormal first trimester screen S/p +1st trimester screen with subsequent negative AFP. Normal anatomy scan.  Preterm labor symptoms and general obstetric precautions including but not limited to vaginal bleeding, contractions, leaking of fluid and fetal movement were reviewed in detail with the patient. Please refer to After Visit Summary for other counseling recommendations.   Return in about 4 weeks (around 12/09/2016).   Caryl AdaJazma Phelps, DO [facility], [facility] 11/11/2016, 2:57 PM

## 2016-11-25 ENCOUNTER — Encounter (HOSPITAL_COMMUNITY): Payer: Self-pay | Admitting: Emergency Medicine

## 2016-11-25 ENCOUNTER — Emergency Department (HOSPITAL_COMMUNITY)
Admission: EM | Admit: 2016-11-25 | Discharge: 2016-11-25 | Disposition: A | Discharge status: Home / Self Care | Payer: Medicaid Other | Attending: Emergency Medicine | Admitting: Emergency Medicine

## 2016-11-25 DIAGNOSIS — Z5321 Procedure and treatment not carried out due to patient leaving prior to being seen by health care provider: Secondary | ICD-10-CM | POA: Insufficient documentation

## 2016-11-25 NOTE — ED Triage Notes (Signed)
Pt c/o vaginal discharge and itching after using a new feminine hygiene product. Pt [redacted] weeks pregnant.

## 2016-11-25 NOTE — ED Notes (Signed)
Called pt for urine in waiting area without answer x 3

## 2016-11-28 ENCOUNTER — Inpatient Hospital Stay (HOSPITAL_COMMUNITY)
Admission: AD | Admit: 2016-11-28 | Discharge: 2016-11-28 | Disposition: A | Discharge status: Home / Self Care | Payer: Medicaid Other | Source: Ambulatory Visit | Attending: Obstetrics & Gynecology | Admitting: Obstetrics & Gynecology

## 2016-11-28 ENCOUNTER — Encounter (HOSPITAL_COMMUNITY): Payer: Self-pay | Admitting: *Deleted

## 2016-11-28 DIAGNOSIS — O26892 Other specified pregnancy related conditions, second trimester: Secondary | ICD-10-CM | POA: Diagnosis present

## 2016-11-28 DIAGNOSIS — Z3A27 27 weeks gestation of pregnancy: Secondary | ICD-10-CM | POA: Insufficient documentation

## 2016-11-28 DIAGNOSIS — R1030 Lower abdominal pain, unspecified: Secondary | ICD-10-CM | POA: Insufficient documentation

## 2016-11-28 DIAGNOSIS — B373 Candidiasis of vulva and vagina: Secondary | ICD-10-CM | POA: Diagnosis not present

## 2016-11-28 DIAGNOSIS — O9989 Other specified diseases and conditions complicating pregnancy, childbirth and the puerperium: Secondary | ICD-10-CM

## 2016-11-28 DIAGNOSIS — O98812 Other maternal infectious and parasitic diseases complicating pregnancy, second trimester: Secondary | ICD-10-CM | POA: Diagnosis not present

## 2016-11-28 DIAGNOSIS — B3731 Acute candidiasis of vulva and vagina: Secondary | ICD-10-CM

## 2016-11-28 DIAGNOSIS — N949 Unspecified condition associated with female genital organs and menstrual cycle: Secondary | ICD-10-CM

## 2016-11-28 LAB — URINALYSIS, ROUTINE W REFLEX MICROSCOPIC
BILIRUBIN URINE: NEGATIVE
GLUCOSE, UA: NEGATIVE mg/dL
Hgb urine dipstick: NEGATIVE
KETONES UR: NEGATIVE mg/dL
Nitrite: NEGATIVE
PH: 6 (ref 5.0–8.0)
PROTEIN: 30 mg/dL — AB
Specific Gravity, Urine: 1.018 (ref 1.005–1.030)

## 2016-11-28 LAB — WET PREP, GENITAL
Clue Cells Wet Prep HPF POC: NONE SEEN
Sperm: NONE SEEN
TRICH WET PREP: NONE SEEN

## 2016-11-28 MED ORDER — TERCONAZOLE 0.8 % VA CREA: 1.0000 | TOPICAL_CREAM | Freq: Every day | VAGINAL | 0 refills | Status: DC

## 2016-11-28 NOTE — Discharge Instructions (Signed)

## 2016-11-28 NOTE — MAU Provider Note (Signed)
History     CSN: [id number]  Arrival date and time: 11/28/16 1445   First Provider Initiated Contact with Patient 11/28/16 1607      Chief Complaint  Patient presents with  . Abdominal Pain  . Vaginal Discharge    HPI: Rebecca Eaton is a 21 y.o. G2P1001 with IUP at 3227w1d who presents to maternity admissions reporting lower abdominal discomfort and vaginal discharge. She reports that she has been more active at work, and has been experiencing more pain on her lower abdomen, radiating to her back. Pain worse with movement. Also reports noting increase in vaginal discharge, but denies any large gush of fluid. Denies contractions, or vaginal bleeding. Reprots good fetal movement.  Also denies any abnormal vaginal discharge, fevers, chills, sweats, dysuria, nausea, vomiting, other GI or GU symptoms or other general symptoms.  [facility] Pregnancy complicated by abnormal first trimester screen; otherwise uncomplicated.   Past obstetric history: OB History  Gravida Para Term Preterm AB Living  2 1 1     1   SAB TAB Ectopic Multiple Live Births          1    # Outcome Date GA Lbr Len/2nd Weight Sex Delivery Anes PTL Lv  2 Current           1 Term 07/21/12     Vag-Spont       Obstetric Comments  G1: 7lbs 8oz. No issues.     Past Medical History:  Diagnosis Date  . Asthma     Past Surgical History:  Procedure Laterality Date  . NO PAST SURGERIES      Family History  Problem Relation Age of Onset  . Heart disease Mother   . Heart disease Father   . Diabetes Paternal Grandmother     Social History  Substance Use Topics  . Smoking status: Never Smoker  . Smokeless tobacco: Never Used  . Alcohol use No    Allergies:  Allergies  Allergen Reactions  . Peanut-Containing Drug Products Anaphylaxis  . Latex Rash    Prescriptions Prior to Admission  Medication Sig Dispense Refill Last Dose  . loratadine (CLARITIN) 10 MG tablet Take 10 mg by mouth daily as  needed for allergies.   Not Taking  . Prenatal Vit-Fe Fumarate-FA (PRENATAL MULTIVITAMIN) TABS tablet Take 2 tablets by mouth daily at 12 noon.   Taking    Review of Systems - Negative except for what is mentioned in HPI.  Physical Exam   Blood pressure 118/71, pulse 80, temperature 98.5 F (36.9 C), temperature source Oral, resp. rate 18, height 5\' 3"  (1.6 m), weight 163 lb (73.9 kg), last menstrual period 05/22/2016, SpO2 99 %.  Constitutional: Well-developed, well-nourished female in no acute distress.  Cardiovascular: normal rate, regular rythm Respiratory: normal effort, lungs CTAB.  GI: Abd soft, non-tender, gravid appropriate for gestational age.   GU: Neg CVAT.  Pelvic: NEFG, white discharge, no blood, no pooling, cervix clean. No CMT. Cervix closed MSK: Extremities nontender, no edema, normal ROM Neurologic: Alert and oriented x 4. Psych: Normal mood and affect   FHT:  Baseline 140 , moderate variability, accelerations present, no decelerations Toco: no ctx  MAU Course  Procedures  MDM Exam reassuring as above. Wet prep +yeast GC/Ch probe sent  Assessment and Plan  Assessment: 1. Yeast vaginitis   2. Round ligament pain     Plan: --Rx: terconazole vag cream  --Advised used of pregnancy support belt to help with round ligament  pain --Discharge home in stable condition.  --PTL precautions    Yuliana Vandrunen, Kandra Nicolas, MD 11/28/2016 4:08 PM

## 2016-12-02 LAB — GC/CHLAMYDIA PROBE AMP (~~LOC~~) NOT AT ARMC
CHLAMYDIA, DNA PROBE: NEGATIVE
NEISSERIA GONORRHEA: NEGATIVE

## 2016-12-11 ENCOUNTER — Ambulatory Visit (INDEPENDENT_AMBULATORY_CARE_PROVIDER_SITE_OTHER): Payer: Self-pay | Admitting: Advanced Practice Midwife

## 2016-12-11 ENCOUNTER — Encounter: Payer: Self-pay | Admitting: Advanced Practice Midwife

## 2016-12-11 VITALS — BP 117/62 | HR 96 | Wt 166.2 lb

## 2016-12-11 DIAGNOSIS — R102 Pelvic and perineal pain: Secondary | ICD-10-CM

## 2016-12-11 DIAGNOSIS — Z23 Encounter for immunization: Secondary | ICD-10-CM

## 2016-12-11 DIAGNOSIS — Z3483 Encounter for supervision of other normal pregnancy, third trimester: Secondary | ICD-10-CM

## 2016-12-11 DIAGNOSIS — O26893 Other specified pregnancy related conditions, third trimester: Secondary | ICD-10-CM

## 2016-12-11 DIAGNOSIS — Z3480 Encounter for supervision of other normal pregnancy, unspecified trimester: Secondary | ICD-10-CM

## 2016-12-11 MED ORDER — COMFORT FIT MATERNITY SUPP MED MISC: 1.0000 | Freq: Every day | 0 refills | Status: DC

## 2016-12-11 NOTE — Progress Notes (Signed)
   PRENATAL VISIT NOTE  Subjective:  Rebecca Eaton is a 21 y.o. G2P1001 at [redacted]w[redacted]d being seen today for ongoing prenatal care.  She is currently monitored for the following issues for this low-risk pregnancy and has Supervision of other normal pregnancy, antepartum; Cervical dysplasia; and Abnormal first trimester screen on her problem list.  Patient reports occasional contractions and intermittent pelvic pain.  Contractions: Not present. Vag. Bleeding: None.  Movement: Present. Denies leaking of fluid.   The following portions of the patient's history were reviewed and updated as appropriate: allergies, current medications, past family history, past medical history, past social history, past surgical history and problem list. Problem list updated.  Objective:   Vitals:   12/11/16 0822  BP: 117/62  Pulse: 96  Weight: 166 lb 3.2 oz (75.4 kg)    Fetal Status: Fetal Heart Rate (bpm): 150   Movement: Present     General:  Alert, oriented and cooperative. Patient is in no acute distress.  Skin: Skin is warm and dry. No rash noted.   Cardiovascular: Normal heart rate noted  Respiratory: Normal respiratory effort, no problems with respiration noted  Abdomen: Soft, gravid, appropriate for gestational age.  Pain/Pressure: Present     Pelvic: Cervical exam deferred        Extremities: Normal range of motion.  Edema: None  Mental Status:  Normal mood and affect. Normal behavior. Normal judgment and thought content.   Assessment and Plan:  Pregnancy: G2P1001 at [redacted]w[redacted]d  1. Supervision of other normal pregnancy, antepartum  - Tdap vaccine greater than or equal to 7yo IM - RPR - CBC - Glucose Tolerance, 2 Hours w/1 Hour - HIV antibody  2. Pelvic pain affecting pregnancy in third trimester, antepartum --Increase PO fluids/rest/ice/heat/warm bath/Tylenol/pregnancy support belt - Elastic Bandages & Supports (COMFORT FIT MATERNITY SUPP MED) MISC; 1 Device by Does not apply route daily.   Dispense: 1 each; Refill: 0  Preterm labor symptoms and general obstetric precautions including but not limited to vaginal bleeding, contractions, leaking of fluid and fetal movement were reviewed in detail with the patient. Please refer to After Visit Summary for other counseling recommendations.  Return in about 2 weeks (around 12/25/2016).   Sharen Counter, CNM

## 2016-12-11 NOTE — Patient Instructions (Signed)
Third Trimester of Pregnancy The third trimester is from week 28 through week 40 (months 7 through 9). The third trimester is a time when the unborn baby (fetus) is growing rapidly. At the end of the ninth month, the fetus is about 20 inches in length and weighs 6-10 pounds. Body changes during your third trimester Your body will continue to go through many changes during pregnancy. The changes vary from woman to woman. During the third trimester:  Your weight will continue to increase. You can expect to gain 25-35 pounds (11-16 kg) by the end of the pregnancy.  You may begin to get stretch marks on your hips, abdomen, and breasts.  You may urinate more often because the fetus is moving lower into your pelvis and pressing on your bladder.  You may develop or continue to have heartburn. This is caused by increased hormones that slow down muscles in the digestive tract.  You may develop or continue to have constipation because increased hormones slow digestion and cause the muscles that push waste through your intestines to relax.  You may develop hemorrhoids. These are swollen veins (varicose veins) in the rectum that can itch or be painful.  You may develop swollen, bulging veins (varicose veins) in your legs.  You may have increased body aches in the pelvis, back, or thighs. This is due to weight gain and increased hormones that are relaxing your joints.  You may have changes in your hair. These can include thickening of your hair, rapid growth, and changes in texture. Some women also have hair loss during or after pregnancy, or hair that feels dry or thin. Your hair will most likely return to normal after your baby is born.  Your breasts will continue to grow and they will continue to become tender. A yellow fluid (colostrum) may leak from your breasts. This is the first milk you are producing for your baby.  Your belly button may stick out.  You may notice more swelling in your hands,  face, or ankles.  You may have increased tingling or numbness in your hands, arms, and legs. The skin on your belly may also feel numb.  You may feel short of breath because of your expanding uterus.  You may have more problems sleeping. This can be caused by the size of your belly, increased need to urinate, and an increase in your body's metabolism.  You may notice the fetus "dropping," or moving lower in your abdomen (lightening).  You may have increased vaginal discharge.  You may notice your joints feel loose and you may have pain around your pelvic bone.  What to expect at prenatal visits You will have prenatal exams every 2 weeks until week 36. Then you will have weekly prenatal exams. During a routine prenatal visit:  You will be weighed to make sure you and the baby are growing normally.  Your blood pressure will be taken.  Your abdomen will be measured to track your baby's growth.  The fetal heartbeat will be listened to.  Any test results from the previous visit will be discussed.  You may have a cervical check near your due date to see if your cervix has softened or thinned (effaced).  You will be tested for Group B streptococcus. This happens between 35 and 37 weeks.  Your health care provider may ask you:  What your birth plan is.  How you are feeling.  If you are feeling the baby move.  If you have had   any abnormal symptoms, such as leaking fluid, bleeding, severe headaches, or abdominal cramping.  If you are using any tobacco products, including cigarettes, chewing tobacco, and electronic cigarettes.  If you have any questions.  Other tests or screenings that may be performed during your third trimester include:  Blood tests that check for low iron levels (anemia).  Fetal testing to check the health, activity level, and growth of the fetus. Testing is done if you have certain medical conditions or if there are problems during the  pregnancy.  Nonstress test (NST). This test checks the health of your baby to make sure there are no signs of problems, such as the baby not getting enough oxygen. During this test, a belt is placed around your belly. The baby is made to move, and its heart rate is monitored during movement.  What is false labor? False labor is a condition in which you feel small, irregular tightenings of the muscles in the womb (contractions) that usually go away with rest, changing position, or drinking water. These are called Braxton Hicks contractions. Contractions may last for hours, days, or even weeks before true labor sets in. If contractions come at regular intervals, become more frequent, increase in intensity, or become painful, you should see your health care provider. What are the signs of labor?  Abdominal cramps.  Regular contractions that start at 10 minutes apart and become stronger and more frequent with time.  Contractions that start on the top of the uterus and spread down to the lower abdomen and back.  Increased pelvic pressure and dull back pain.  A watery or bloody mucus discharge that comes from the vagina.  Leaking of amniotic fluid. This is also known as your "water breaking." It could be a slow trickle or a gush. Let your health care provider know if it has a color or strange odor. If you have any of these signs, call your health care provider right away, even if it is before your due date. Follow these instructions at home: Medicines  Follow your health care provider's instructions regarding medicine use. Specific medicines may be either safe or unsafe to take during pregnancy.  Take a prenatal vitamin that contains at least 600 micrograms (mcg) of folic acid.  If you develop constipation, try taking a stool softener if your health care provider approves. Eating and drinking  Eat a balanced diet that includes fresh fruits and vegetables, whole grains, good sources of protein  such as meat, eggs, or tofu, and low-fat dairy. Your health care provider will help you determine the amount of weight gain that is right for you.  Avoid raw meat and uncooked cheese. These carry germs that can cause birth defects in the baby.  If you have low calcium intake from food, talk to your health care provider about whether you should take a daily calcium supplement.  Eat four or five small meals rather than three large meals a day.  Limit foods that are high in fat and processed sugars, such as fried and sweet foods.  To prevent constipation: ? Drink enough fluid to keep your urine clear or pale yellow. ? Eat foods that are high in fiber, such as fresh fruits and vegetables, whole grains, and beans. Activity  Exercise only as directed by your health care provider. Most women can continue their usual exercise routine during pregnancy. Try to exercise for 30 minutes at least 5 days a week. Stop exercising if you experience uterine contractions.  Avoid heavy   lifting.  Do not exercise in extreme heat or humidity, or at high altitudes.  Wear low-heel, comfortable shoes.  Practice good posture.  You may continue to have sex unless your health care provider tells you otherwise. Relieving pain and discomfort  Take frequent breaks and rest with your legs elevated if you have leg cramps or low back pain.  Take warm sitz baths to soothe any pain or discomfort caused by hemorrhoids. Use hemorrhoid cream if your health care provider approves.  Wear a good support bra to prevent discomfort from breast tenderness.  If you develop varicose veins: ? Wear support pantyhose or compression stockings as told by your healthcare provider. ? Elevate your feet for 15 minutes, 3-4 times a day. Prenatal care  Write down your questions. Take them to your prenatal visits.  Keep all your prenatal visits as told by your health care provider. This is important. Safety  Wear your seat belt at  all times when driving.  Make a list of emergency phone numbers, including numbers for family, friends, the hospital, and police and fire departments. General instructions  Avoid cat litter boxes and soil used by cats. These carry germs that can cause birth defects in the baby. If you have a cat, ask someone to clean the litter box for you.  Do not travel far distances unless it is absolutely necessary and only with the approval of your health care provider.  Do not use hot tubs, steam rooms, or saunas.  Do not drink alcohol.  Do not use any products that contain nicotine or tobacco, such as cigarettes and e-cigarettes. If you need help quitting, ask your health care provider.  Do not use any medicinal herbs or unprescribed drugs. These chemicals affect the formation and growth of the baby.  Do not douche or use tampons or scented sanitary pads.  Do not cross your legs for long periods of time.  To prepare for the arrival of your baby: ? Take prenatal classes to understand, practice, and ask questions about labor and delivery. ? Make a trial run to the hospital. ? Visit the hospital and tour the maternity area. ? Arrange for maternity or paternity leave through employers. ? Arrange for family and friends to take care of pets while you are in the hospital. ? Purchase a rear-facing car seat and make sure you know how to install it in your car. ? Pack your hospital bag. ? Prepare the baby's nursery. Make sure to remove all pillows and stuffed animals from the baby's crib to prevent suffocation.  Visit your dentist if you have not gone during your pregnancy. Use a soft toothbrush to brush your teeth and be gentle when you floss. Contact a health care provider if:  You are unsure if you are in labor or if your water has broken.  You become dizzy.  You have mild pelvic cramps, pelvic pressure, or nagging pain in your abdominal area.  You have lower back pain.  You have persistent  nausea, vomiting, or diarrhea.  You have an unusual or bad smelling vaginal discharge.  You have pain when you urinate. Get help right away if:  Your water breaks before 37 weeks.  You have regular contractions less than 5 minutes apart before 37 weeks.  You have a fever.  You are leaking fluid from your vagina.  You have spotting or bleeding from your vagina.  You have severe abdominal pain or cramping.  You have rapid weight loss or weight gain.    You have shortness of breath with chest pain.  You notice sudden or extreme swelling of your face, hands, ankles, feet, or legs.  Your baby makes fewer than 10 movements in 2 hours.  You have severe headaches that do not go away when you take medicine.  You have vision changes. Summary  The third trimester is from week 28 through week 40, months 7 through 9. The third trimester is a time when the unborn baby (fetus) is growing rapidly.  During the third trimester, your discomfort may increase as you and your baby continue to gain weight. You may have abdominal, leg, and back pain, sleeping problems, and an increased need to urinate.  During the third trimester your breasts will keep growing and they will continue to become tender. A yellow fluid (colostrum) may leak from your breasts. This is the first milk you are producing for your baby.  False labor is a condition in which you feel small, irregular tightenings of the muscles in the womb (contractions) that eventually go away. These are called Braxton Hicks contractions. Contractions may last for hours, days, or even weeks before true labor sets in.  Signs of labor can include: abdominal cramps; regular contractions that start at 10 minutes apart and become stronger and more frequent with time; watery or bloody mucus discharge that comes from the vagina; increased pelvic pressure and dull back pain; and leaking of amniotic fluid. This information is not intended to replace advice  given to you by your health care provider. Make sure you discuss any questions you have with your health care provider. Document Released: 04/02/2001 Document Revised: 09/14/2015 Document Reviewed: 06/09/2012 Elsevier Interactive Patient Education  2017 [employer]  

## 2016-12-12 LAB — HIV ANTIBODY (ROUTINE TESTING W REFLEX): HIV SCREEN 4TH GENERATION: NONREACTIVE

## 2016-12-12 LAB — CBC
Hematocrit: 31 % — ABNORMAL LOW (ref 34.0–46.6)
Hemoglobin: 10 g/dL — ABNORMAL LOW (ref 11.1–15.9)
MCH: 27.5 pg (ref 26.6–33.0)
MCHC: 32.3 g/dL (ref 31.5–35.7)
MCV: 85 fL (ref 79–97)
Platelets: 342 10*3/uL (ref 150–379)
RBC: 3.64 x10E6/uL — AB (ref 3.77–5.28)
RDW: 13.2 % (ref 12.3–15.4)
WBC: 12 10*3/uL — ABNORMAL HIGH (ref 3.4–10.8)

## 2016-12-12 LAB — RPR: RPR Ser Ql: NONREACTIVE

## 2016-12-12 LAB — GLUCOSE TOLERANCE, 2 HOURS W/ 1HR
GLUCOSE, FASTING: 67 mg/dL (ref 65–91)
Glucose, 1 hour: 95 mg/dL (ref 65–179)
Glucose, 2 hour: 77 mg/dL (ref 65–152)

## 2016-12-25 ENCOUNTER — Ambulatory Visit (INDEPENDENT_AMBULATORY_CARE_PROVIDER_SITE_OTHER): Payer: Self-pay | Admitting: Advanced Practice Midwife

## 2016-12-25 ENCOUNTER — Encounter: Payer: Self-pay | Admitting: Advanced Practice Midwife

## 2016-12-25 VITALS — BP 134/67 | HR 84 | Wt 168.0 lb

## 2016-12-25 DIAGNOSIS — Z3480 Encounter for supervision of other normal pregnancy, unspecified trimester: Secondary | ICD-10-CM

## 2016-12-25 DIAGNOSIS — Z3483 Encounter for supervision of other normal pregnancy, third trimester: Secondary | ICD-10-CM

## 2016-12-25 NOTE — Patient Instructions (Signed)
Third Trimester of Pregnancy The third trimester is from week 28 through week 40 (months 7 through 9). The third trimester is a time when the unborn baby (fetus) is growing rapidly. At the end of the ninth month, the fetus is about 20 inches in length and weighs 6-10 pounds. Body changes during your third trimester Your body will continue to go through many changes during pregnancy. The changes vary from woman to woman. During the third trimester:  Your weight will continue to increase. You can expect to gain 25-35 pounds (11-16 kg) by the end of the pregnancy.  You may begin to get stretch marks on your hips, abdomen, and breasts.  You may urinate more often because the fetus is moving lower into your pelvis and pressing on your bladder.  You may develop or continue to have heartburn. This is caused by increased hormones that slow down muscles in the digestive tract.  You may develop or continue to have constipation because increased hormones slow digestion and cause the muscles that push waste through your intestines to relax.  You may develop hemorrhoids. These are swollen veins (varicose veins) in the rectum that can itch or be painful.  You may develop swollen, bulging veins (varicose veins) in your legs.  You may have increased body aches in the pelvis, back, or thighs. This is due to weight gain and increased hormones that are relaxing your joints.  You may have changes in your hair. These can include thickening of your hair, rapid growth, and changes in texture. Some women also have hair loss during or after pregnancy, or hair that feels dry or thin. Your hair will most likely return to normal after your baby is born.  Your breasts will continue to grow and they will continue to become tender. A yellow fluid (colostrum) may leak from your breasts. This is the first milk you are producing for your baby.  Your belly button may stick out.  You may notice more swelling in your hands,  face, or ankles.  You may have increased tingling or numbness in your hands, arms, and legs. The skin on your belly may also feel numb.  You may feel short of breath because of your expanding uterus.  You may have more problems sleeping. This can be caused by the size of your belly, increased need to urinate, and an increase in your body's metabolism.  You may notice the fetus "dropping," or moving lower in your abdomen (lightening).  You may have increased vaginal discharge.  You may notice your joints feel loose and you may have pain around your pelvic bone.  What to expect at prenatal visits You will have prenatal exams every 2 weeks until week 36. Then you will have weekly prenatal exams. During a routine prenatal visit:  You will be weighed to make sure you and the baby are growing normally.  Your blood pressure will be taken.  Your abdomen will be measured to track your baby's growth.  The fetal heartbeat will be listened to.  Any test results from the previous visit will be discussed.  You may have a cervical check near your due date to see if your cervix has softened or thinned (effaced).  You will be tested for Group B streptococcus. This happens between 35 and 37 weeks.  Your health care provider may ask you:  What your birth plan is.  How you are feeling.  If you are feeling the baby move.  If you have had   any abnormal symptoms, such as leaking fluid, bleeding, severe headaches, or abdominal cramping.  If you are using any tobacco products, including cigarettes, chewing tobacco, and electronic cigarettes.  If you have any questions.  Other tests or screenings that may be performed during your third trimester include:  Blood tests that check for low iron levels (anemia).  Fetal testing to check the health, activity level, and growth of the fetus. Testing is done if you have certain medical conditions or if there are problems during the  pregnancy.  Nonstress test (NST). This test checks the health of your baby to make sure there are no signs of problems, such as the baby not getting enough oxygen. During this test, a belt is placed around your belly. The baby is made to move, and its heart rate is monitored during movement.  What is false labor? False labor is a condition in which you feel small, irregular tightenings of the muscles in the womb (contractions) that usually go away with rest, changing position, or drinking water. These are called Braxton Hicks contractions. Contractions may last for hours, days, or even weeks before true labor sets in. If contractions come at regular intervals, become more frequent, increase in intensity, or become painful, you should see your health care provider. What are the signs of labor?  Abdominal cramps.  Regular contractions that start at 10 minutes apart and become stronger and more frequent with time.  Contractions that start on the top of the uterus and spread down to the lower abdomen and back.  Increased pelvic pressure and dull back pain.  A watery or bloody mucus discharge that comes from the vagina.  Leaking of amniotic fluid. This is also known as your "water breaking." It could be a slow trickle or a gush. Let your health care provider know if it has a color or strange odor. If you have any of these signs, call your health care provider right away, even if it is before your due date. Follow these instructions at home: Medicines  Follow your health care provider's instructions regarding medicine use. Specific medicines may be either safe or unsafe to take during pregnancy.  Take a prenatal vitamin that contains at least 600 micrograms (mcg) of folic acid.  If you develop constipation, try taking a stool softener if your health care provider approves. Eating and drinking  Eat a balanced diet that includes fresh fruits and vegetables, whole grains, good sources of protein  such as meat, eggs, or tofu, and low-fat dairy. Your health care provider will help you determine the amount of weight gain that is right for you.  Avoid raw meat and uncooked cheese. These carry germs that can cause birth defects in the baby.  If you have low calcium intake from food, talk to your health care provider about whether you should take a daily calcium supplement.  Eat four or five small meals rather than three large meals a day.  Limit foods that are high in fat and processed sugars, such as fried and sweet foods.  To prevent constipation: ? Drink enough fluid to keep your urine clear or pale yellow. ? Eat foods that are high in fiber, such as fresh fruits and vegetables, whole grains, and beans. Activity  Exercise only as directed by your health care provider. Most women can continue their usual exercise routine during pregnancy. Try to exercise for 30 minutes at least 5 days a week. Stop exercising if you experience uterine contractions.  Avoid heavy   lifting.  Do not exercise in extreme heat or humidity, or at high altitudes.  Wear low-heel, comfortable shoes.  Practice good posture.  You may continue to have sex unless your health care provider tells you otherwise. Relieving pain and discomfort  Take frequent breaks and rest with your legs elevated if you have leg cramps or low back pain.  Take warm sitz baths to soothe any pain or discomfort caused by hemorrhoids. Use hemorrhoid cream if your health care provider approves.  Wear a good support bra to prevent discomfort from breast tenderness.  If you develop varicose veins: ? Wear support pantyhose or compression stockings as told by your healthcare provider. ? Elevate your feet for 15 minutes, 3-4 times a day. Prenatal care  Write down your questions. Take them to your prenatal visits.  Keep all your prenatal visits as told by your health care provider. This is important. Safety  Wear your seat belt at  all times when driving.  Make a list of emergency phone numbers, including numbers for family, friends, the hospital, and police and fire departments. General instructions  Avoid cat litter boxes and soil used by cats. These carry germs that can cause birth defects in the baby. If you have a cat, ask someone to clean the litter box for you.  Do not travel far distances unless it is absolutely necessary and only with the approval of your health care provider.  Do not use hot tubs, steam rooms, or saunas.  Do not drink alcohol.  Do not use any products that contain nicotine or tobacco, such as cigarettes and e-cigarettes. If you need help quitting, ask your health care provider.  Do not use any medicinal herbs or unprescribed drugs. These chemicals affect the formation and growth of the baby.  Do not douche or use tampons or scented sanitary pads.  Do not cross your legs for long periods of time.  To prepare for the arrival of your baby: ? Take prenatal classes to understand, practice, and ask questions about labor and delivery. ? Make a trial run to the hospital. ? Visit the hospital and tour the maternity area. ? Arrange for maternity or paternity leave through employers. ? Arrange for family and friends to take care of pets while you are in the hospital. ? Purchase a rear-facing car seat and make sure you know how to install it in your car. ? Pack your hospital bag. ? Prepare the baby's nursery. Make sure to remove all pillows and stuffed animals from the baby's crib to prevent suffocation.  Visit your dentist if you have not gone during your pregnancy. Use a soft toothbrush to brush your teeth and be gentle when you floss. Contact a health care provider if:  You are unsure if you are in labor or if your water has broken.  You become dizzy.  You have mild pelvic cramps, pelvic pressure, or nagging pain in your abdominal area.  You have lower back pain.  You have persistent  nausea, vomiting, or diarrhea.  You have an unusual or bad smelling vaginal discharge.  You have pain when you urinate. Get help right away if:  Your water breaks before 37 weeks.  You have regular contractions less than 5 minutes apart before 37 weeks.  You have a fever.  You are leaking fluid from your vagina.  You have spotting or bleeding from your vagina.  You have severe abdominal pain or cramping.  You have rapid weight loss or weight gain.    You have shortness of breath with chest pain.  You notice sudden or extreme swelling of your face, hands, ankles, feet, or legs.  Your baby makes fewer than 10 movements in 2 hours.  You have severe headaches that do not go away when you take medicine.  You have vision changes. Summary  The third trimester is from week 28 through week 40, months 7 through 9. The third trimester is a time when the unborn baby (fetus) is growing rapidly.  During the third trimester, your discomfort may increase as you and your baby continue to gain weight. You may have abdominal, leg, and back pain, sleeping problems, and an increased need to urinate.  During the third trimester your breasts will keep growing and they will continue to become tender. A yellow fluid (colostrum) may leak from your breasts. This is the first milk you are producing for your baby.  False labor is a condition in which you feel small, irregular tightenings of the muscles in the womb (contractions) that eventually go away. These are called Braxton Hicks contractions. Contractions may last for hours, days, or even weeks before true labor sets in.  Signs of labor can include: abdominal cramps; regular contractions that start at 10 minutes apart and become stronger and more frequent with time; watery or bloody mucus discharge that comes from the vagina; increased pelvic pressure and dull back pain; and leaking of amniotic fluid. This information is not intended to replace advice  given to you by your health care provider. Make sure you discuss any questions you have with your health care provider. Document Released: 04/02/2001 Document Revised: 09/14/2015 Document Reviewed: 06/09/2012 Elsevier Interactive Patient Education  2017 [employer]  

## 2016-12-25 NOTE — Progress Notes (Signed)
   PRENATAL VISIT NOTE  Subjective:  Rebecca Eaton is a 21 y.o. G2P1001 at 3241w0d being seen today for ongoing prenatal care.  She is currently monitored for the following issues for this low-risk pregnancy and has Supervision of other normal pregnancy, antepartum; Cervical dysplasia; and Abnormal first trimester screen on her problem list.  Patient reports no complaints.  Contractions: Irregular. Vag. Bleeding: None.  Movement: Present. Denies leaking of fluid.   The following portions of the patient's history were reviewed and updated as appropriate: allergies, current medications, past family history, past medical history, past social history, past surgical history and problem list. Problem list updated.  Objective:   Vitals:   12/25/16 1147  BP: 134/67  Pulse: 84  Weight: 168 lb (76.2 kg)    Fetal Status: Fetal Heart Rate (bpm): 145 Fundal Height: 31 cm Movement: Present     General:  Alert, oriented and cooperative. Patient is in no acute distress.  Skin: Skin is warm and dry. No rash noted.   Cardiovascular: Normal heart rate noted  Respiratory: Normal respiratory effort, no problems with respiration noted  Abdomen: Soft, gravid, appropriate for gestational age.  Pain/Pressure: Present     Pelvic: Cervical exam deferred        Extremities: Normal range of motion.  Edema: Trace  Mental Status:  Normal mood and affect. Normal behavior. Normal judgment and thought content.   Assessment and Plan:  Pregnancy: G2P1001 at 9041w0d  1. Supervision of other normal pregnancy, antepartum --Anticipatory guidance about next visit/next weeks of pregnancy given  Preterm labor symptoms and general obstetric precautions including but not limited to vaginal bleeding, contractions, leaking of fluid and fetal movement were reviewed in detail with the patient. Please refer to After Visit Summary for other counseling recommendations.  Return in about 2 weeks (around 01/08/2017).   Sharen CounterLisa  Leftwich-Kirby, CNM

## 2016-12-25 NOTE — Progress Notes (Signed)
Patient here for routine OB visit 1155w0d.

## 2017-01-13 ENCOUNTER — Ambulatory Visit (INDEPENDENT_AMBULATORY_CARE_PROVIDER_SITE_OTHER): Payer: Self-pay | Admitting: Certified Nurse Midwife

## 2017-01-13 VITALS — BP 121/69 | HR 93 | Wt 170.5 lb

## 2017-01-13 DIAGNOSIS — Z3483 Encounter for supervision of other normal pregnancy, third trimester: Secondary | ICD-10-CM

## 2017-01-13 DIAGNOSIS — Z3480 Encounter for supervision of other normal pregnancy, unspecified trimester: Secondary | ICD-10-CM

## 2017-01-13 NOTE — Patient Instructions (Signed)

## 2017-01-13 NOTE — Progress Notes (Signed)
Subjective:  Rebecca Eaton is a 21 y.o. G2P1001 at [redacted]w[redacted]d being seen today for ongoing prenatal care.  She is currently monitored for the following issues for this low-risk pregnancy and has Supervision of other normal pregnancy, antepartum; Cervical dysplasia; and Abnormal first trimester screen on her problem list.  Patient reports no complaints.  Contractions: Not present.  .  Movement: Present. Denies leaking of fluid.   The following portions of the patient's history were reviewed and updated as appropriate: allergies, current medications, past family history, past medical history, past social history, past surgical history and problem list. Problem list updated.  Objective:   Vitals:   01/13/17 1536  BP: 121/69  Pulse: 93  Weight: 170 lb 8 oz (77.3 kg)    Fetal Status: Fetal Heart Rate (bpm): 132 Fundal Height: 33 cm Movement: Present  Presentation: Vertex  General:  Alert, oriented and cooperative. Patient is in no acute distress.  Skin: Skin is warm and dry. No rash noted.   Cardiovascular: Normal heart rate noted  Respiratory: Normal respiratory effort, no problems with respiration noted  Abdomen: Soft, gravid, appropriate for gestational age. Pain/Pressure: Present     Pelvic:       Cervical exam deferred        Extremities: Normal range of motion.  Edema: Trace  Mental Status: Normal mood and affect. Normal behavior. Normal judgment and thought content.   Urinalysis:      Assessment and Plan:  Pregnancy: G2P1001 at [redacted]w[redacted]d  1. Supervision of other normal pregnancy, antepartum  Preterm labor symptoms and general obstetric precautions including but not limited to vaginal bleeding, contractions, leaking of fluid and fetal movement were reviewed in detail with the patient. Please refer to After Visit Summary for other counseling recommendations.  Return in about 2 weeks (around 01/27/2017).   Donette Larry, CNM

## 2017-01-16 ENCOUNTER — Encounter (HOSPITAL_COMMUNITY): Payer: Self-pay

## 2017-01-16 ENCOUNTER — Inpatient Hospital Stay (HOSPITAL_COMMUNITY)
Admission: AD | Admit: 2017-01-16 | Discharge: 2017-01-16 | Disposition: A | Discharge status: Home / Self Care | Payer: Medicaid Other | Source: Ambulatory Visit | Attending: Obstetrics & Gynecology | Admitting: Obstetrics & Gynecology

## 2017-01-16 DIAGNOSIS — Z3689 Encounter for other specified antenatal screening: Secondary | ICD-10-CM

## 2017-01-16 DIAGNOSIS — O26893 Other specified pregnancy related conditions, third trimester: Secondary | ICD-10-CM

## 2017-01-16 DIAGNOSIS — N898 Other specified noninflammatory disorders of vagina: Secondary | ICD-10-CM

## 2017-01-16 DIAGNOSIS — Z3A34 34 weeks gestation of pregnancy: Secondary | ICD-10-CM

## 2017-01-16 DIAGNOSIS — O9989 Other specified diseases and conditions complicating pregnancy, childbirth and the puerperium: Secondary | ICD-10-CM

## 2017-01-16 LAB — POCT FERN TEST: POCT Fern Test: NEGATIVE

## 2017-01-16 NOTE — MAU Provider Note (Signed)
History   [id number]   Chief Complaint  Patient presents with  . Rupture of Membranes    HPI Rebecca Eaton is a 21 y.o. female  G2P1001 .1 weeks here with report of leaking clear fluid around 1700.  Leaking of fluid has continued but small amt.  Pt reports contractions none. She denies vaginal bleeding. Last intercourse was yesterday. She reports good fetal movement. All other systems negative.    Patient's last menstrual period was 05/22/2016.  OB History  Gravida Para Term Preterm AB Living  SAB TAB Ectopic Multiple Live Births          1    # Outcome Date GA Lbr Len/2nd Weight Sex Delivery Anes PTL Lv  2 Current           1 Term 07/21/12     Vag-Spont       Obstetric Comments  G1: 7lbs 8oz. No issues.     Past Medical History:  Diagnosis Date  . Asthma     Family History  Problem Relation Age of Onset  . Heart disease Mother   . Heart disease Father   . Diabetes Paternal Grandmother     Social History   Social History  . Marital status: Single    Spouse name: N/A  . Number of children: N/A  . Years of education: N/A   Social History Main Topics  . Smoking status: Never Smoker  . Smokeless tobacco: Never Used  . Alcohol use No  . Drug use: No  . Sexual activity: Yes   Other Topics Concern  . None   Social History Narrative  . None    Allergies  Allergen Reactions  . Peanut-Containing Drug Products Anaphylaxis  . Latex Rash    No current facility-administered medications on file prior to encounter.    Current Outpatient Prescriptions on File Prior to Encounter  Medication Sig Dispense Refill  . Elastic Bandages & Supports (COMFORT FIT MATERNITY SUPP MED) MISC 1 Device by Does not apply route daily. (Patient not taking: Reported on 12/25/2016) 1 each 0  . loratadine (CLARITIN) 10 MG tablet Take 10 mg by mouth daily as needed for allergies.    . Prenatal Vit-Fe Fumarate-FA (PRENATAL MULTIVITAMIN) TABS tablet Take 2 tablets by  mouth daily at 12 noon.    Marland Kitchen terconazole (TERAZOL 3) 0.8 % vaginal cream Place 1 applicator vaginally at bedtime. (Patient not taking: Reported on 12/11/2016) 20 g 0     Review of Systems  Gastrointestinal: Negative for abdominal pain.  Genitourinary: Positive for vaginal discharge. Negative for vaginal bleeding.     Physical Exam   Vitals:   01/16/17 1949  BP: 118/67  Pulse: 88  Resp: 17  Temp: 98.4 F (36.9 C)  TempSrc: Oral    Physical Exam  Constitutional: She is oriented to person, place, and time. She appears well-developed and well-nourished.  HENT:  Head: Normocephalic.  Neck: Normal range of motion.  Cardiovascular: Normal rate.   Respiratory: Effort normal.  GI: Soft. She exhibits no distension. There is no tenderness.  gravid  Genitourinary:  Genitourinary Comments: Speculum: no pool, fern neg Cervix closed/thick  Musculoskeletal: Normal range of motion.  Neurological: She is alert and oriented to person, place, and time.  Skin: Skin is warm and dry.  Psychiatric: She has a normal mood and affect.  EFM: 135 bpm, mod variability, + accels, no decels Toco: rare  Results for orders placed  or performed during the hospital encounter of 01/16/17 (from the past 24 hour(s))  Fern Test     Status: Normal   Collection Time: 01/16/17  8:28 PM  Result Value Ref Range   POCT Fern Test Negative = intact amniotic membranes    MAU Course  Procedures  MDM Labs ordered and reviewed. No evidence of ROM. Vaginal discharge likely d/t recent IC.   Assessment and Plan   1. [redacted] weeks gestation of pregnancy   2. NST (non-stress test) reactive   3. Vaginal discharge during pregnancy in third trimester    Discharge home Follow up in OB office as scheduled PTL precautions  Allergies as of 01/16/2017      Reactions   Peanut-containing Drug Products Anaphylaxis   Latex Rash      Medication List    TAKE these medications   COMFORT FIT MATERNITY SUPP MED Misc 1  Device by Does not apply route daily.   loratadine 10 MG tablet Commonly known as:  CLARITIN Take 10 mg by mouth daily as needed for allergies.   prenatal multivitamin Tabs tablet Take 2 tablets by mouth daily at 12 noon.   terconazole 0.8 % vaginal cream Commonly known as:  TERAZOL 3 Place 1 applicator vaginally at bedtime.            Discharge Care Instructions        Start     Ordered   01/16/17 0000  Discharge patient    Question Answer Comment  Discharge disposition 01-Home or Self Care   Discharge patient date 01/16/2017      01/16/17 2038     Donette Larry, [employer] 01/16/2017 8:14 PM

## 2017-01-16 NOTE — Discharge Instructions (Signed)
Braxton Hicks Contractions Contractions of the uterus can occur throughout pregnancy, but they are not always a sign that you are in labor. You may have practice contractions called Braxton Hicks contractions. These false labor contractions are sometimes confused with true labor. What are Braxton Hicks contractions? Braxton Hicks contractions are tightening movements that occur in the muscles of the uterus before labor. Unlike true labor contractions, these contractions do not result in opening (dilation) and thinning of the cervix. Toward the end of pregnancy (32-34 weeks), Braxton Hicks contractions can happen more often and may become stronger. These contractions are sometimes difficult to tell apart from true labor because they can be very uncomfortable. You should not feel embarrassed if you go to the hospital with false labor. Sometimes, the only way to tell if you are in true labor is for your health care provider to look for changes in the cervix. The health care provider will do a physical exam and may monitor your contractions. If you are not in true labor, the exam should show that your cervix is not dilating and your water has not broken. If there are no prenatal problems or other health problems associated with your pregnancy, it is completely safe for you to be sent home with false labor. You may continue to have Braxton Hicks contractions until you go into true labor. How can I tell the difference between true labor and false labor?  Differences  False labor  Contractions last 30-70 seconds.: Contractions are usually shorter and not as strong as true labor contractions.  Contractions become very regular.: Contractions are usually irregular.  Discomfort is usually felt in the top of the uterus, and it spreads to the lower abdomen and low back.: Contractions are often felt in the front of the lower abdomen and in the groin.  Contractions do not go away with walking.: Contractions may  go away when you walk around or change positions while lying down.  Contractions usually become more intense and increase in frequency.: Contractions get weaker and are shorter-lasting as time goes on.  The cervix dilates and gets thinner.: The cervix usually does not dilate or become thin. Follow these instructions at home:  Take over-the-counter and prescription medicines only as told by your health care provider.  Keep up with your usual exercises and follow other instructions from your health care provider.  Eat and drink lightly if you think you are going into labor.  If Braxton Hicks contractions are making you uncomfortable:  Change your position from lying down or resting to walking, or change from walking to resting.  Sit and rest in a tub of warm water.  Drink enough fluid to keep your urine clear or pale yellow. Dehydration may cause these contractions.  Do slow and deep breathing several times an hour.  Keep all follow-up prenatal visits as told by your health care provider. This is important. Contact a health care provider if:  You have a fever.  You have continuous pain in your abdomen. Get help right away if:  Your contractions become stronger, more regular, and closer together.  You have fluid leaking or gushing from your vagina.  You pass blood-tinged mucus (bloody show).  You have bleeding from your vagina.  You have low back pain that you never had before.  You feel your baby's head pushing down and causing pelvic pressure.  Your baby is not moving inside you as much as it used to. Summary  Contractions that occur before labor are   called Braxton Hicks contractions, false labor, or practice contractions.  Braxton Hicks contractions are usually shorter, weaker, farther apart, and less regular than true labor contractions. True labor contractions usually become progressively stronger and regular and they become more frequent.  Manage discomfort from  Braxton Hicks contractions by changing position, resting in a warm bath, drinking plenty of water, or practicing deep breathing. This information is not intended to replace advice given to you by your health care provider. Make sure you discuss any questions you have with your health care provider. Document Released: 04/08/2005 Document Revised: 02/26/2016 Document Reviewed: 02/26/2016 Elsevier Interactive Patient Education  2017 [employer] Third Trimester of Pregnancy The third trimester is from week 28 through week 40 (months 7 through 9). The third trimester is a time when the unborn baby (fetus) is growing rapidly. At the end of the ninth month, the fetus is about 20 inches in length and weighs 6-10 pounds. Body changes during your third trimester Your body will continue to go through many changes during pregnancy. The changes vary from woman to woman. During the third trimester:  Your weight will continue to increase. You can expect to gain 25-35 pounds (11-16 kg) by the end of the pregnancy.  You may begin to get stretch marks on your hips, abdomen, and breasts.  You may urinate more often because the fetus is moving lower into your pelvis and pressing on your bladder.  You may develop or continue to have heartburn. This is caused by increased hormones that slow down muscles in the digestive tract.  You may develop or continue to have constipation because increased hormones slow digestion and cause the muscles that push waste through your intestines to relax.  You may develop hemorrhoids. These are swollen veins (varicose veins) in the rectum that can itch or be painful.  You may develop swollen, bulging veins (varicose veins) in your legs.  You may have increased body aches in the pelvis, back, or thighs. This is due to weight gain and increased hormones that are relaxing your joints.  You may have changes in your hair. These can include thickening of your hair, rapid growth, and  changes in texture. Some women also have hair loss during or after pregnancy, or hair that feels dry or thin. Your hair will most likely return to normal after your baby is born.  Your breasts will continue to grow and they will continue to become tender. A yellow fluid (colostrum) may leak from your breasts. This is the first milk you are producing for your baby.  Your belly button may stick out.  You may notice more swelling in your hands, face, or ankles.  You may have increased tingling or numbness in your hands, arms, and legs. The skin on your belly may also feel numb.  You may feel short of breath because of your expanding uterus.  You may have more problems sleeping. This can be caused by the size of your belly, increased need to urinate, and an increase in your body's metabolism.  You may notice the fetus "dropping," or moving lower in your abdomen (lightening).  You may have increased vaginal discharge.  You may notice your joints feel loose and you may have pain around your pelvic bone. What to expect at prenatal visits You will have prenatal exams every 2 weeks until week 36. Then you will have weekly prenatal exams. During a routine prenatal visit:  You will be weighed to make sure you and the baby   are growing normally.  Your blood pressure will be taken.  Your abdomen will be measured to track your baby's growth.  The fetal heartbeat will be listened to.  Any test results from the previous visit will be discussed.  You may have a cervical check near your due date to see if your cervix has softened or thinned (effaced).  You will be tested for Group B streptococcus. This happens between 35 and 37 weeks. Your health care provider may ask you:  What your birth plan is.  How you are feeling.  If you are feeling the baby move.  If you have had any abnormal symptoms, such as leaking fluid, bleeding, severe headaches, or abdominal cramping.  If you are using any  tobacco products, including cigarettes, chewing tobacco, and electronic cigarettes.  If you have any questions. Other tests or screenings that may be performed during your third trimester include:  Blood tests that check for low iron levels (anemia).  Fetal testing to check the health, activity level, and growth of the fetus. Testing is done if you have certain medical conditions or if there are problems during the pregnancy.  Nonstress test (NST). This test checks the health of your baby to make sure there are no signs of problems, such as the baby not getting enough oxygen. During this test, a belt is placed around your belly. The baby is made to move, and its heart rate is monitored during movement. What is false labor? False labor is a condition in which you feel small, irregular tightenings of the muscles in the womb (contractions) that usually go away with rest, changing position, or drinking water. These are called Braxton Hicks contractions. Contractions may last for hours, days, or even weeks before true labor sets in. If contractions come at regular intervals, become more frequent, increase in intensity, or become painful, you should see your health care provider. What are the signs of labor?  Abdominal cramps.  Regular contractions that start at 10 minutes apart and become stronger and more frequent with time.  Contractions that start on the top of the uterus and spread down to the lower abdomen and back.  Increased pelvic pressure and dull back pain.  A watery or bloody mucus discharge that comes from the vagina.  Leaking of amniotic fluid. This is also known as your "water breaking." It could be a slow trickle or a gush. Let your health care provider know if it has a color or strange odor. If you have any of these signs, call your health care provider right away, even if it is before your due date. Follow these instructions at home: Medicines   Follow your health care  provider's instructions regarding medicine use. Specific medicines may be either safe or unsafe to take during pregnancy.  Take a prenatal vitamin that contains at least 600 micrograms (mcg) of folic acid.  If you develop constipation, try taking a stool softener if your health care provider approves. Eating and drinking   Eat a balanced diet that includes fresh fruits and vegetables, whole grains, good sources of protein such as meat, eggs, or tofu, and low-fat dairy. Your health care provider will help you determine the amount of weight gain that is right for you.  Avoid raw meat and uncooked cheese. These carry germs that can cause birth defects in the baby.  If you have low calcium intake from food, talk to your health care provider about whether you should take a daily calcium supplement.    Eat four or five small meals rather than three large meals a day.  Limit foods that are high in fat and processed sugars, such as fried and sweet foods.  To prevent constipation:  Drink enough fluid to keep your urine clear or pale yellow.  Eat foods that are high in fiber, such as fresh fruits and vegetables, whole grains, and beans. Activity   Exercise only as directed by your health care provider. Most women can continue their usual exercise routine during pregnancy. Try to exercise for 30 minutes at least 5 days a week. Stop exercising if you experience uterine contractions.  Avoid heavy lifting.  Do not exercise in extreme heat or humidity, or at high altitudes.  Wear low-heel, comfortable shoes.  Practice good posture.  You may continue to have sex unless your health care provider tells you otherwise. Relieving pain and discomfort   Take frequent breaks and rest with your legs elevated if you have leg cramps or low back pain.  Take warm sitz baths to soothe any pain or discomfort caused by hemorrhoids. Use hemorrhoid cream if your health care provider approves.  Wear a good  support bra to prevent discomfort from breast tenderness.  If you develop varicose veins:  Wear support pantyhose or compression stockings as told by your healthcare provider.  Elevate your feet for 15 minutes, 3-4 times a day. Prenatal care   Write down your questions. Take them to your prenatal visits.  Keep all your prenatal visits as told by your health care provider. This is important. Safety   Wear your seat belt at all times when driving.  Make a list of emergency phone numbers, including numbers for family, friends, the hospital, and police and fire departments. General instructions   Avoid cat litter boxes and soil used by cats. These carry germs that can cause birth defects in the baby. If you have a cat, ask someone to clean the litter box for you.  Do not travel far distances unless it is absolutely necessary and only with the approval of your health care provider.  Do not use hot tubs, steam rooms, or saunas.  Do not drink alcohol.  Do not use any products that contain nicotine or tobacco, such as cigarettes and e-cigarettes. If you need help quitting, ask your health care provider.  Do not use any medicinal herbs or unprescribed drugs. These chemicals affect the formation and growth of the baby.  Do not douche or use tampons or scented sanitary pads.  Do not cross your legs for long periods of time.  To prepare for the arrival of your baby:  Take prenatal classes to understand, practice, and ask questions about labor and delivery.  Make a trial run to the hospital.  Visit the hospital and tour the maternity area.  Arrange for maternity or paternity leave through employers.  Arrange for family and friends to take care of pets while you are in the hospital.  Purchase a rear-facing car seat and make sure you know how to install it in your car.  Pack your hospital bag.  Prepare the baby's nursery. Make sure to remove all pillows and stuffed animals from  the baby's crib to prevent suffocation.  Visit your dentist if you have not gone during your pregnancy. Use a soft toothbrush to brush your teeth and be gentle when you floss. Contact a health care provider if:  You are unsure if you are in labor or if your water has broken.  You   become dizzy.  You have mild pelvic cramps, pelvic pressure, or nagging pain in your abdominal area.  You have lower back pain.  You have persistent nausea, vomiting, or diarrhea.  You have an unusual or bad smelling vaginal discharge.  You have pain when you urinate. Get help right away if:  Your water breaks before 37 weeks.  You have regular contractions less than 5 minutes apart before 37 weeks.  You have a fever.  You are leaking fluid from your vagina.  You have spotting or bleeding from your vagina.  You have severe abdominal pain or cramping.  You have rapid weight loss or weight gain.  You have shortness of breath with chest pain.  You notice sudden or extreme swelling of your face, hands, ankles, feet, or legs.  Your baby makes fewer than 10 movements in 2 hours.  You have severe headaches that do not go away when you take medicine.  You have vision changes. Summary  The third trimester is from week 28 through week 40, months 7 through 9. The third trimester is a time when the unborn baby (fetus) is growing rapidly.  During the third trimester, your discomfort may increase as you and your baby continue to gain weight. You may have abdominal, leg, and back pain, sleeping problems, and an increased need to urinate.  During the third trimester your breasts will keep growing and they will continue to become tender. A yellow fluid (colostrum) may leak from your breasts. This is the first milk you are producing for your baby.  False labor is a condition in which you feel small, irregular tightenings of the muscles in the womb (contractions) that eventually go away. These are called  Braxton Hicks contractions. Contractions may last for hours, days, or even weeks before true labor sets in.  Signs of labor can include: abdominal cramps; regular contractions that start at 10 minutes apart and become stronger and more frequent with time; watery or bloody mucus discharge that comes from the vagina; increased pelvic pressure and dull back pain; and leaking of amniotic fluid. This information is not intended to replace advice given to you by your health care provider. Make sure you discuss any questions you have with your health care provider. Document Released: 04/02/2001 Document Revised: 09/14/2015 Document Reviewed: 06/09/2012 Elsevier Interactive Patient Education  2017 [employer]  

## 2017-01-16 NOTE — MAU Note (Signed)
Presents for possible ROM around 1700 when on the toilet.  Has had clear watery discharge since then.  No VB.  Reports good fetal movement.  No hx of any preterm labor.

## 2017-01-27 ENCOUNTER — Ambulatory Visit (INDEPENDENT_AMBULATORY_CARE_PROVIDER_SITE_OTHER): Payer: Self-pay | Admitting: Advanced Practice Midwife

## 2017-01-27 VITALS — BP 113/68 | HR 92 | Wt 169.8 lb

## 2017-01-27 DIAGNOSIS — Z3480 Encounter for supervision of other normal pregnancy, unspecified trimester: Secondary | ICD-10-CM

## 2017-01-27 NOTE — Patient Instructions (Signed)
Third Trimester of Pregnancy The third trimester is from week 28 through week 40 (months 7 through 9). The third trimester is a time when the unborn baby (fetus) is growing rapidly. At the end of the ninth month, the fetus is about 20 inches in length and weighs 6-10 pounds. Body changes during your third trimester Your body will continue to go through many changes during pregnancy. The changes vary from woman to woman. During the third trimester:  Your weight will continue to increase. You can expect to gain 25-35 pounds (11-16 kg) by the end of the pregnancy.  You may begin to get stretch marks on your hips, abdomen, and breasts.  You may urinate more often because the fetus is moving lower into your pelvis and pressing on your bladder.  You may develop or continue to have heartburn. This is caused by increased hormones that slow down muscles in the digestive tract.  You may develop or continue to have constipation because increased hormones slow digestion and cause the muscles that push waste through your intestines to relax.  You may develop hemorrhoids. These are swollen veins (varicose veins) in the rectum that can itch or be painful.  You may develop swollen, bulging veins (varicose veins) in your legs.  You may have increased body aches in the pelvis, back, or thighs. This is due to weight gain and increased hormones that are relaxing your joints.  You may have changes in your hair. These can include thickening of your hair, rapid growth, and changes in texture. Some women also have hair loss during or after pregnancy, or hair that feels dry or thin. Your hair will most likely return to normal after your baby is born.  Your breasts will continue to grow and they will continue to become tender. A yellow fluid (colostrum) may leak from your breasts. This is the first milk you are producing for your baby.  Your belly button may stick out.  You may notice more swelling in your hands,  face, or ankles.  You may have increased tingling or numbness in your hands, arms, and legs. The skin on your belly may also feel numb.  You may feel short of breath because of your expanding uterus.  You may have more problems sleeping. This can be caused by the size of your belly, increased need to urinate, and an increase in your body's metabolism.  You may notice the fetus "dropping," or moving lower in your abdomen (lightening).  You may have increased vaginal discharge.  You may notice your joints feel loose and you may have pain around your pelvic bone.  What to expect at prenatal visits You will have prenatal exams every 2 weeks until week 36. Then you will have weekly prenatal exams. During a routine prenatal visit:  You will be weighed to make sure you and the baby are growing normally.  Your blood pressure will be taken.  Your abdomen will be measured to track your baby's growth.  The fetal heartbeat will be listened to.  Any test results from the previous visit will be discussed.  You may have a cervical check near your due date to see if your cervix has softened or thinned (effaced).  You will be tested for Group B streptococcus. This happens between 35 and 37 weeks.  Your health care provider may ask you:  What your birth plan is.  How you are feeling.  If you are feeling the baby move.  If you have had   any abnormal symptoms, such as leaking fluid, bleeding, severe headaches, or abdominal cramping.  If you are using any tobacco products, including cigarettes, chewing tobacco, and electronic cigarettes.  If you have any questions.  Other tests or screenings that may be performed during your third trimester include:  Blood tests that check for low iron levels (anemia).  Fetal testing to check the health, activity level, and growth of the fetus. Testing is done if you have certain medical conditions or if there are problems during the  pregnancy.  Nonstress test (NST). This test checks the health of your baby to make sure there are no signs of problems, such as the baby not getting enough oxygen. During this test, a belt is placed around your belly. The baby is made to move, and its heart rate is monitored during movement.  What is false labor? False labor is a condition in which you feel small, irregular tightenings of the muscles in the womb (contractions) that usually go away with rest, changing position, or drinking water. These are called Braxton Hicks contractions. Contractions may last for hours, days, or even weeks before true labor sets in. If contractions come at regular intervals, become more frequent, increase in intensity, or become painful, you should see your health care provider. What are the signs of labor?  Abdominal cramps.  Regular contractions that start at 10 minutes apart and become stronger and more frequent with time.  Contractions that start on the top of the uterus and spread down to the lower abdomen and back.  Increased pelvic pressure and dull back pain.  A watery or bloody mucus discharge that comes from the vagina.  Leaking of amniotic fluid. This is also known as your "water breaking." It could be a slow trickle or a gush. Let your health care provider know if it has a color or strange odor. If you have any of these signs, call your health care provider right away, even if it is before your due date. Follow these instructions at home: Medicines  Follow your health care provider's instructions regarding medicine use. Specific medicines may be either safe or unsafe to take during pregnancy.  Take a prenatal vitamin that contains at least 600 micrograms (mcg) of folic acid.  If you develop constipation, try taking a stool softener if your health care provider approves. Eating and drinking  Eat a balanced diet that includes fresh fruits and vegetables, whole grains, good sources of protein  such as meat, eggs, or tofu, and low-fat dairy. Your health care provider will help you determine the amount of weight gain that is right for you.  Avoid raw meat and uncooked cheese. These carry germs that can cause birth defects in the baby.  If you have low calcium intake from food, talk to your health care provider about whether you should take a daily calcium supplement.  Eat four or five small meals rather than three large meals a day.  Limit foods that are high in fat and processed sugars, such as fried and sweet foods.  To prevent constipation: ? Drink enough fluid to keep your urine clear or pale yellow. ? Eat foods that are high in fiber, such as fresh fruits and vegetables, whole grains, and beans. Activity  Exercise only as directed by your health care provider. Most women can continue their usual exercise routine during pregnancy. Try to exercise for 30 minutes at least 5 days a week. Stop exercising if you experience uterine contractions.  Avoid heavy   lifting.  Do not exercise in extreme heat or humidity, or at high altitudes.  Wear low-heel, comfortable shoes.  Practice good posture.  You may continue to have sex unless your health care provider tells you otherwise. Relieving pain and discomfort  Take frequent breaks and rest with your legs elevated if you have leg cramps or low back pain.  Take warm sitz baths to soothe any pain or discomfort caused by hemorrhoids. Use hemorrhoid cream if your health care provider approves.  Wear a good support bra to prevent discomfort from breast tenderness.  If you develop varicose veins: ? Wear support pantyhose or compression stockings as told by your healthcare provider. ? Elevate your feet for 15 minutes, 3-4 times a day. Prenatal care  Write down your questions. Take them to your prenatal visits.  Keep all your prenatal visits as told by your health care provider. This is important. Safety  Wear your seat belt at  all times when driving.  Make a list of emergency phone numbers, including numbers for family, friends, the hospital, and police and fire departments. General instructions  Avoid cat litter boxes and soil used by cats. These carry germs that can cause birth defects in the baby. If you have a cat, ask someone to clean the litter box for you.  Do not travel far distances unless it is absolutely necessary and only with the approval of your health care provider.  Do not use hot tubs, steam rooms, or saunas.  Do not drink alcohol.  Do not use any products that contain nicotine or tobacco, such as cigarettes and e-cigarettes. If you need help quitting, ask your health care provider.  Do not use any medicinal herbs or unprescribed drugs. These chemicals affect the formation and growth of the baby.  Do not douche or use tampons or scented sanitary pads.  Do not cross your legs for long periods of time.  To prepare for the arrival of your baby: ? Take prenatal classes to understand, practice, and ask questions about labor and delivery. ? Make a trial run to the hospital. ? Visit the hospital and tour the maternity area. ? Arrange for maternity or paternity leave through employers. ? Arrange for family and friends to take care of pets while you are in the hospital. ? Purchase a rear-facing car seat and make sure you know how to install it in your car. ? Pack your hospital bag. ? Prepare the baby's nursery. Make sure to remove all pillows and stuffed animals from the baby's crib to prevent suffocation.  Visit your dentist if you have not gone during your pregnancy. Use a soft toothbrush to brush your teeth and be gentle when you floss. Contact a health care provider if:  You are unsure if you are in labor or if your water has broken.  You become dizzy.  You have mild pelvic cramps, pelvic pressure, or nagging pain in your abdominal area.  You have lower back pain.  You have persistent  nausea, vomiting, or diarrhea.  You have an unusual or bad smelling vaginal discharge.  You have pain when you urinate. Get help right away if:  Your water breaks before 37 weeks.  You have regular contractions less than 5 minutes apart before 37 weeks.  You have a fever.  You are leaking fluid from your vagina.  You have spotting or bleeding from your vagina.  You have severe abdominal pain or cramping.  You have rapid weight loss or weight gain.    You have shortness of breath with chest pain.  You notice sudden or extreme swelling of your face, hands, ankles, feet, or legs.  Your baby makes fewer than 10 movements in 2 hours.  You have severe headaches that do not go away when you take medicine.  You have vision changes. Summary  The third trimester is from week 28 through week 40, months 7 through 9. The third trimester is a time when the unborn baby (fetus) is growing rapidly.  During the third trimester, your discomfort may increase as you and your baby continue to gain weight. You may have abdominal, leg, and back pain, sleeping problems, and an increased need to urinate.  During the third trimester your breasts will keep growing and they will continue to become tender. A yellow fluid (colostrum) may leak from your breasts. This is the first milk you are producing for your baby.  False labor is a condition in which you feel small, irregular tightenings of the muscles in the womb (contractions) that eventually go away. These are called Braxton Hicks contractions. Contractions may last for hours, days, or even weeks before true labor sets in.  Signs of labor can include: abdominal cramps; regular contractions that start at 10 minutes apart and become stronger and more frequent with time; watery or bloody mucus discharge that comes from the vagina; increased pelvic pressure and dull back pain; and leaking of amniotic fluid. This information is not intended to replace advice  given to you by your health care provider. Make sure you discuss any questions you have with your health care provider. Document Released: 04/02/2001 Document Revised: 09/14/2015 Document Reviewed: 06/09/2012 Elsevier Interactive Patient Education  2017 [employer]  

## 2017-01-27 NOTE — Progress Notes (Signed)
   PRENATAL VISIT NOTE  Subjective:  Rebecca Eaton is a 21 y.o. G2P1001 at [redacted]w[redacted]d being seen today for ongoing prenatal care.  She is currently monitored for the following issues for this low-risk pregnancy and has Supervision of other normal pregnancy, antepartum; Cervical dysplasia; and Abnormal first trimester screen on her problem list.  Patient reports no complaints.  Contractions: Irregular. Vag. Bleeding: None.  Movement: Present. Denies leaking of fluid.   The following portions of the patient's history were reviewed and updated as appropriate: allergies, current medications, past family history, past medical history, past social history, past surgical history and problem list. Problem list updated.  Objective:   Vitals:   01/27/17 1059  BP: 113/68  Pulse: 92  Weight: 169 lb 12.8 oz (77 kg)    Fetal Status: Fetal Heart Rate (bpm): 127   Movement: Present     General:  Alert, oriented and cooperative. Patient is in no acute distress.  Skin: Skin is warm and dry. No rash noted.   Cardiovascular: Normal heart rate noted  Respiratory: Normal respiratory effort, no problems with respiration noted  Abdomen: Soft, gravid, appropriate for gestational age.  Pain/Pressure: Present     Pelvic: Cervical exam deferred        Extremities: Normal range of motion.  Edema: Trace  Mental Status:  Normal mood and affect. Normal behavior. Normal judgment and thought content.   Assessment and Plan:  Pregnancy: G2P1001 at [redacted]w[redacted]d  1. Supervision of other normal pregnancy, antepartum - GBS at next visit  Term labor symptoms and general obstetric precautions including but not limited to vaginal bleeding, contractions, leaking of fluid and fetal movement were reviewed in detail with the patient. Please refer to After Visit Summary for other counseling recommendations.  Return in about 1 week (around 02/03/2017).   Thressa Sheller, CNM

## 2017-02-03 ENCOUNTER — Ambulatory Visit (INDEPENDENT_AMBULATORY_CARE_PROVIDER_SITE_OTHER): Payer: Medicaid Other | Admitting: Advanced Practice Midwife

## 2017-02-03 ENCOUNTER — Other Ambulatory Visit (HOSPITAL_COMMUNITY)
Admission: RE | Admit: 2017-02-03 | Discharge: 2017-02-03 | Disposition: A | Discharge status: Home / Self Care | Payer: Medicaid Other | Source: Ambulatory Visit | Attending: Advanced Practice Midwife | Admitting: Advanced Practice Midwife

## 2017-02-03 VITALS — BP 124/49 | HR 87 | Wt 174.3 lb

## 2017-02-03 DIAGNOSIS — Z3483 Encounter for supervision of other normal pregnancy, third trimester: Secondary | ICD-10-CM

## 2017-02-03 LAB — OB RESULTS CONSOLE GC/CHLAMYDIA: Gonorrhea: NEGATIVE

## 2017-02-03 NOTE — Progress Notes (Signed)
Patient ID: Rebecca Eaton, female   DOB: [birth year], 23 y.o.   MRN: [id number]    PRENATAL VISIT NOTE  Subjective:  Rebecca Eaton is a 21 y.o. G2P1001 at [redacted]w[redacted]d being seen today for ongoing prenatal care.  She is currently monitored for the following issues for this low-risk pregnancy and has Supervision of other normal pregnancy, antepartum; Cervical dysplasia; and Abnormal first trimester screen on her problem list.  Patient reports no complaints.  Contractions: Irritability. Vag. Bleeding: None.  Movement: Present. Denies leaking of fluid.   The following portions of the patient's history were reviewed and updated as appropriate: allergies, current medications, past family history, past medical history, past social history, past surgical history and problem list. Problem list updated.  Objective:   Vitals:   02/03/17 1441  BP: (!) 124/49  Pulse: 87  Weight: 174 lb 4.8 oz (79.1 kg)    Fetal Status: Fetal Heart Rate (bpm): 140   Movement: Present     General:  Alert, oriented and cooperative. Patient is in no acute distress.  Skin: Skin is warm and dry. No rash noted.   Cardiovascular: Normal heart rate noted  Respiratory: Normal respiratory effort, no problems with respiration noted  Abdomen: Soft, gravid, appropriate for gestational age.  Pain/Pressure: Present     Pelvic: Cervical exam performed        Extremities: Normal range of motion.  Edema: Rebecca  Mental Status:  Normal mood and affect. Normal behavior. Normal judgment and thought content.   Assessment and Plan:  Pregnancy: G2P1001 at [redacted]w[redacted]d  1. Encounter for supervision of other normal pregnancy in third trimester  - Strep Gp B NAA - Cervicovaginal ancillary only  Term labor symptoms and general obstetric precautions including but not limited to vaginal bleeding, contractions, leaking of fluid and fetal movement were reviewed in detail with the patient. Please refer to After Visit Summary for other counseling  recommendations.  Return in about 1 week (around 02/10/2017).   Thressa Sheller, CNM

## 2017-02-03 NOTE — Patient Instructions (Signed)
Third Trimester of Pregnancy The third trimester is from week 28 through week 40 (months 7 through 9). The third trimester is a time when the unborn baby (fetus) is growing rapidly. At the end of the ninth month, the fetus is about 20 inches in length and weighs 6-10 pounds. Body changes during your third trimester Your body will continue to go through many changes during pregnancy. The changes vary from woman to woman. During the third trimester:  Your weight will continue to increase. You can expect to gain 25-35 pounds (11-16 kg) by the end of the pregnancy.  You may begin to get stretch marks on your hips, abdomen, and breasts.  You may urinate more often because the fetus is moving lower into your pelvis and pressing on your bladder.  You may develop or continue to have heartburn. This is caused by increased hormones that slow down muscles in the digestive tract.  You may develop or continue to have constipation because increased hormones slow digestion and cause the muscles that push waste through your intestines to relax.  You may develop hemorrhoids. These are swollen veins (varicose veins) in the rectum that can itch or be painful.  You may develop swollen, bulging veins (varicose veins) in your legs.  You may have increased body aches in the pelvis, back, or thighs. This is due to weight gain and increased hormones that are relaxing your joints.  You may have changes in your hair. These can include thickening of your hair, rapid growth, and changes in texture. Some women also have hair loss during or after pregnancy, or hair that feels dry or thin. Your hair will most likely return to normal after your baby is born.  Your breasts will continue to grow and they will continue to become tender. A yellow fluid (colostrum) may leak from your breasts. This is the first milk you are producing for your baby.  Your belly button may stick out.  You may notice more swelling in your hands,  face, or ankles.  You may have increased tingling or numbness in your hands, arms, and legs. The skin on your belly may also feel numb.  You may feel short of breath because of your expanding uterus.  You may have more problems sleeping. This can be caused by the size of your belly, increased need to urinate, and an increase in your body's metabolism.  You may notice the fetus "dropping," or moving lower in your abdomen (lightening).  You may have increased vaginal discharge.  You may notice your joints feel loose and you may have pain around your pelvic bone.  What to expect at prenatal visits You will have prenatal exams every 2 weeks until week 36. Then you will have weekly prenatal exams. During a routine prenatal visit:  You will be weighed to make sure you and the baby are growing normally.  Your blood pressure will be taken.  Your abdomen will be measured to track your baby's growth.  The fetal heartbeat will be listened to.  Any test results from the previous visit will be discussed.  You may have a cervical check near your due date to see if your cervix has softened or thinned (effaced).  You will be tested for Group B streptococcus. This happens between 35 and 37 weeks.  Your health care provider may ask you:  What your birth plan is.  How you are feeling.  If you are feeling the baby move.  If you have had   any abnormal symptoms, such as leaking fluid, bleeding, severe headaches, or abdominal cramping.  If you are using any tobacco products, including cigarettes, chewing tobacco, and electronic cigarettes.  If you have any questions.  Other tests or screenings that may be performed during your third trimester include:  Blood tests that check for low iron levels (anemia).  Fetal testing to check the health, activity level, and growth of the fetus. Testing is done if you have certain medical conditions or if there are problems during the  pregnancy.  Nonstress test (NST). This test checks the health of your baby to make sure there are no signs of problems, such as the baby not getting enough oxygen. During this test, a belt is placed around your belly. The baby is made to move, and its heart rate is monitored during movement.  What is false labor? False labor is a condition in which you feel small, irregular tightenings of the muscles in the womb (contractions) that usually go away with rest, changing position, or drinking water. These are called Braxton Hicks contractions. Contractions may last for hours, days, or even weeks before true labor sets in. If contractions come at regular intervals, become more frequent, increase in intensity, or become painful, you should see your health care provider. What are the signs of labor?  Abdominal cramps.  Regular contractions that start at 10 minutes apart and become stronger and more frequent with time.  Contractions that start on the top of the uterus and spread down to the lower abdomen and back.  Increased pelvic pressure and dull back pain.  A watery or bloody mucus discharge that comes from the vagina.  Leaking of amniotic fluid. This is also known as your "water breaking." It could be a slow trickle or a gush. Let your health care provider know if it has a color or strange odor. If you have any of these signs, call your health care provider right away, even if it is before your due date. Follow these instructions at home: Medicines  Follow your health care provider's instructions regarding medicine use. Specific medicines may be either safe or unsafe to take during pregnancy.  Take a prenatal vitamin that contains at least 600 micrograms (mcg) of folic acid.  If you develop constipation, try taking a stool softener if your health care provider approves. Eating and drinking  Eat a balanced diet that includes fresh fruits and vegetables, whole grains, good sources of protein  such as meat, eggs, or tofu, and low-fat dairy. Your health care provider will help you determine the amount of weight gain that is right for you.  Avoid raw meat and uncooked cheese. These carry germs that can cause birth defects in the baby.  If you have low calcium intake from food, talk to your health care provider about whether you should take a daily calcium supplement.  Eat four or five small meals rather than three large meals a day.  Limit foods that are high in fat and processed sugars, such as fried and sweet foods.  To prevent constipation: ? Drink enough fluid to keep your urine clear or pale yellow. ? Eat foods that are high in fiber, such as fresh fruits and vegetables, whole grains, and beans. Activity  Exercise only as directed by your health care provider. Most women can continue their usual exercise routine during pregnancy. Try to exercise for 30 minutes at least 5 days a week. Stop exercising if you experience uterine contractions.  Avoid heavy   lifting.  Do not exercise in extreme heat or humidity, or at high altitudes.  Wear low-heel, comfortable shoes.  Practice good posture.  You may continue to have sex unless your health care provider tells you otherwise. Relieving pain and discomfort  Take frequent breaks and rest with your legs elevated if you have leg cramps or low back pain.  Take warm sitz baths to soothe any pain or discomfort caused by hemorrhoids. Use hemorrhoid cream if your health care provider approves.  Wear a good support bra to prevent discomfort from breast tenderness.  If you develop varicose veins: ? Wear support pantyhose or compression stockings as told by your healthcare provider. ? Elevate your feet for 15 minutes, 3-4 times a day. Prenatal care  Write down your questions. Take them to your prenatal visits.  Keep all your prenatal visits as told by your health care provider. This is important. Safety  Wear your seat belt at  all times when driving.  Make a list of emergency phone numbers, including numbers for family, friends, the hospital, and police and fire departments. General instructions  Avoid cat litter boxes and soil used by cats. These carry germs that can cause birth defects in the baby. If you have a cat, ask someone to clean the litter box for you.  Do not travel far distances unless it is absolutely necessary and only with the approval of your health care provider.  Do not use hot tubs, steam rooms, or saunas.  Do not drink alcohol.  Do not use any products that contain nicotine or tobacco, such as cigarettes and e-cigarettes. If you need help quitting, ask your health care provider.  Do not use any medicinal herbs or unprescribed drugs. These chemicals affect the formation and growth of the baby.  Do not douche or use tampons or scented sanitary pads.  Do not cross your legs for long periods of time.  To prepare for the arrival of your baby: ? Take prenatal classes to understand, practice, and ask questions about labor and delivery. ? Make a trial run to the hospital. ? Visit the hospital and tour the maternity area. ? Arrange for maternity or paternity leave through employers. ? Arrange for family and friends to take care of pets while you are in the hospital. ? Purchase a rear-facing car seat and make sure you know how to install it in your car. ? Pack your hospital bag. ? Prepare the baby's nursery. Make sure to remove all pillows and stuffed animals from the baby's crib to prevent suffocation.  Visit your dentist if you have not gone during your pregnancy. Use a soft toothbrush to brush your teeth and be gentle when you floss. Contact a health care provider if:  You are unsure if you are in labor or if your water has broken.  You become dizzy.  You have mild pelvic cramps, pelvic pressure, or nagging pain in your abdominal area.  You have lower back pain.  You have persistent  nausea, vomiting, or diarrhea.  You have an unusual or bad smelling vaginal discharge.  You have pain when you urinate. Get help right away if:  Your water breaks before 37 weeks.  You have regular contractions less than 5 minutes apart before 37 weeks.  You have a fever.  You are leaking fluid from your vagina.  You have spotting or bleeding from your vagina.  You have severe abdominal pain or cramping.  You have rapid weight loss or weight gain.    You have shortness of breath with chest pain.  You notice sudden or extreme swelling of your face, hands, ankles, feet, or legs.  Your baby makes fewer than 10 movements in 2 hours.  You have severe headaches that do not go away when you take medicine.  You have vision changes. Summary  The third trimester is from week 28 through week 40, months 7 through 9. The third trimester is a time when the unborn baby (fetus) is growing rapidly.  During the third trimester, your discomfort may increase as you and your baby continue to gain weight. You may have abdominal, leg, and back pain, sleeping problems, and an increased need to urinate.  During the third trimester your breasts will keep growing and they will continue to become tender. A yellow fluid (colostrum) may leak from your breasts. This is the first milk you are producing for your baby.  False labor is a condition in which you feel small, irregular tightenings of the muscles in the womb (contractions) that eventually go away. These are called Braxton Hicks contractions. Contractions may last for hours, days, or even weeks before true labor sets in.  Signs of labor can include: abdominal cramps; regular contractions that start at 10 minutes apart and become stronger and more frequent with time; watery or bloody mucus discharge that comes from the vagina; increased pelvic pressure and dull back pain; and leaking of amniotic fluid. This information is not intended to replace advice  given to you by your health care provider. Make sure you discuss any questions you have with your health care provider. Document Released: 04/02/2001 Document Revised: 09/14/2015 Document Reviewed: 06/09/2012 Elsevier Interactive Patient Education  2017 [employer]  

## 2017-02-03 NOTE — Progress Notes (Signed)
Gbs/gc&clamydia cultures today

## 2017-02-04 LAB — CERVICOVAGINAL ANCILLARY ONLY
CHLAMYDIA, DNA PROBE: NEGATIVE
Neisseria Gonorrhea: NEGATIVE

## 2017-02-05 LAB — STREP GP B NAA: STREP GROUP B AG: POSITIVE — AB

## 2017-02-10 ENCOUNTER — Ambulatory Visit (INDEPENDENT_AMBULATORY_CARE_PROVIDER_SITE_OTHER): Payer: Self-pay | Admitting: Obstetrics & Gynecology

## 2017-02-10 ENCOUNTER — Encounter: Payer: Medicaid Other | Admitting: Advanced Practice Midwife

## 2017-02-10 VITALS — BP 126/65 | HR 96 | Wt 171.6 lb

## 2017-02-10 DIAGNOSIS — O289 Unspecified abnormal findings on antenatal screening of mother: Secondary | ICD-10-CM

## 2017-02-10 DIAGNOSIS — O99820 Streptococcus B carrier state complicating pregnancy: Secondary | ICD-10-CM | POA: Insufficient documentation

## 2017-02-10 DIAGNOSIS — Z3480 Encounter for supervision of other normal pregnancy, unspecified trimester: Secondary | ICD-10-CM

## 2017-02-10 DIAGNOSIS — Z3483 Encounter for supervision of other normal pregnancy, third trimester: Secondary | ICD-10-CM

## 2017-02-10 NOTE — Progress Notes (Signed)
Educated pt on Skin to Skin  

## 2017-02-10 NOTE — Progress Notes (Signed)
   PRENATAL VISIT NOTE  Subjective:  Rebecca Eaton is a 21 y.o. G2P1001 at 2539w5d being seen today for ongoing prenatal care.  She is currently monitored for the following issues for this low-risk pregnancy and has Supervision of other normal pregnancy, antepartum; Cervical dysplasia; Abnormal first trimester screen; and GBS (group B Streptococcus carrier), +RV culture, currently pregnant on her problem list.  Patient reports no complaints.  Contractions: Irritability. Vag. Bleeding: None.  Movement: Present. Denies leaking of fluid.   The following portions of the patient's history were reviewed and updated as appropriate: allergies, current medications, past family history, past medical history, past social history, past surgical history and problem list. Problem list updated.  Objective:   Vitals:   02/10/17 1424  BP: 126/65  Pulse: 96  Weight: 171 lb 9.6 oz (77.8 kg)    Fetal Status: Fetal Heart Rate (bpm): 130   Movement: Present     General:  Alert, oriented and cooperative. Patient is in no acute distress.  Skin: Skin is warm and dry. No rash noted.   Cardiovascular: Normal heart rate noted  Respiratory: Normal respiratory effort, no problems with respiration noted  Abdomen: Soft, gravid, appropriate for gestational age.  Pain/Pressure: Present     Pelvic: Cervical exam performed        Extremities: Normal range of motion.  Edema: None  Mental Status:  Normal mood and affect. Normal behavior. Normal judgment and thought content.   Assessment and Plan:  Pregnancy: G2P1001 at 3539w5d  1. Supervision of other normal pregnancy, antepartum    4. GBS (group B Streptococcus carrier), +RV culture, currently pregnant - Treat in labor, discussed this with her  Term labor symptoms and general obstetric precautions including but not limited to vaginal bleeding, contractions, leaking of fluid and fetal movement were reviewed in detail with the patient. Please refer to After Visit  Summary for other counseling recommendations.  Return in about 1 week (around 02/17/2017).   Allie BossierMyra C Maria Gallicchio, MD

## 2017-02-17 ENCOUNTER — Ambulatory Visit (INDEPENDENT_AMBULATORY_CARE_PROVIDER_SITE_OTHER): Payer: Self-pay | Admitting: Family Medicine

## 2017-02-17 ENCOUNTER — Encounter (HOSPITAL_COMMUNITY): Payer: Self-pay | Admitting: *Deleted

## 2017-02-17 ENCOUNTER — Inpatient Hospital Stay (HOSPITAL_COMMUNITY)
Admission: AD | Admit: 2017-02-17 | Discharge: 2017-02-19 | DRG: 807 | Disposition: A | Discharge status: Home / Self Care | Payer: Medicaid Other | Source: Ambulatory Visit | Attending: Obstetrics & Gynecology | Admitting: Obstetrics & Gynecology

## 2017-02-17 VITALS — BP 108/56 | HR 92 | Wt 171.2 lb

## 2017-02-17 DIAGNOSIS — O289 Unspecified abnormal findings on antenatal screening of mother: Secondary | ICD-10-CM

## 2017-02-17 DIAGNOSIS — Z87410 Personal history of cervical dysplasia: Secondary | ICD-10-CM

## 2017-02-17 DIAGNOSIS — O99824 Streptococcus B carrier state complicating childbirth: Secondary | ICD-10-CM | POA: Diagnosis present

## 2017-02-17 DIAGNOSIS — Z3483 Encounter for supervision of other normal pregnancy, third trimester: Secondary | ICD-10-CM

## 2017-02-17 DIAGNOSIS — O99820 Streptococcus B carrier state complicating pregnancy: Secondary | ICD-10-CM

## 2017-02-17 DIAGNOSIS — Z91040 Latex allergy status: Secondary | ICD-10-CM

## 2017-02-17 DIAGNOSIS — O26893 Other specified pregnancy related conditions, third trimester: Secondary | ICD-10-CM | POA: Diagnosis present

## 2017-02-17 DIAGNOSIS — Z3480 Encounter for supervision of other normal pregnancy, unspecified trimester: Secondary | ICD-10-CM

## 2017-02-17 DIAGNOSIS — Z3A38 38 weeks gestation of pregnancy: Secondary | ICD-10-CM | POA: Diagnosis not present

## 2017-02-17 LAB — CBC
HEMATOCRIT: 31.9 % — AB (ref 36.0–46.0)
Hemoglobin: 10.3 g/dL — ABNORMAL LOW (ref 12.0–15.0)
MCH: 26.9 pg (ref 26.0–34.0)
MCHC: 32.3 g/dL (ref 30.0–36.0)
MCV: 83.3 fL (ref 78.0–100.0)
Platelets: 334 10*3/uL (ref 150–400)
RBC: 3.83 MIL/uL — ABNORMAL LOW (ref 3.87–5.11)
RDW: 14.4 % (ref 11.5–15.5)
WBC: 13.9 10*3/uL — ABNORMAL HIGH (ref 4.0–10.5)

## 2017-02-17 LAB — POCT FERN TEST: POCT Fern Test: POSITIVE

## 2017-02-17 LAB — TYPE AND SCREEN
ABO/RH(D): O POS
Antibody Screen: NEGATIVE

## 2017-02-17 MED ORDER — OXYTOCIN 40 UNITS IN LACTATED RINGERS INFUSION - SIMPLE MED
2.5000 [IU]/h | INTRAVENOUS | Status: DC
  Filled 2017-02-17: qty 1000

## 2017-02-17 MED ORDER — OXYCODONE-ACETAMINOPHEN 5-325 MG PO TABS: 1.0000 | ORAL_TABLET | ORAL | Status: DC | PRN

## 2017-02-17 MED ORDER — PENICILLIN G POT IN DEXTROSE 60000 UNIT/ML IV SOLN
3.0000 10*6.[IU] | INTRAVENOUS | Status: DC
  Administered 2017-02-18: 3 10*6.[IU] via INTRAVENOUS
  Filled 2017-02-17 (×4): qty 50

## 2017-02-17 MED ORDER — OXYTOCIN BOLUS FROM INFUSION
500.0000 mL | Freq: Once | INTRAVENOUS | Status: AC
  Administered 2017-02-18: 500 mL via INTRAVENOUS

## 2017-02-17 MED ORDER — PENICILLIN G POTASSIUM 5000000 UNITS IJ SOLR
5.0000 10*6.[IU] | Freq: Once | INTRAMUSCULAR | Status: AC
  Administered 2017-02-17: 5 10*6.[IU] via INTRAVENOUS
  Filled 2017-02-17: qty 5

## 2017-02-17 MED ORDER — LIDOCAINE HCL (PF) 1 % IJ SOLN
30.0000 mL | INTRAMUSCULAR | Status: DC | PRN
  Administered 2017-02-18: 30 mL via SUBCUTANEOUS
  Filled 2017-02-17: qty 30

## 2017-02-17 MED ORDER — OXYTOCIN 40 UNITS IN LACTATED RINGERS INFUSION - SIMPLE MED
1.0000 m[IU]/min | INTRAVENOUS | Status: DC
  Administered 2017-02-17: 2 m[IU]/min via INTRAVENOUS

## 2017-02-17 MED ORDER — SOD CITRATE-CITRIC ACID 500-334 MG/5ML PO SOLN: 30.0000 mL | ORAL | Status: DC | PRN

## 2017-02-17 MED ORDER — FLEET ENEMA 7-19 GM/118ML RE ENEM
1.0000 | ENEMA | RECTAL | Status: DC | PRN
Start: 2017-02-17 — End: 2017-02-18

## 2017-02-17 MED ORDER — LACTATED RINGERS IV SOLN
INTRAVENOUS | Status: DC
  Administered 2017-02-17: 19:00:00 via INTRAVENOUS

## 2017-02-17 MED ORDER — FENTANYL CITRATE (PF) 100 MCG/2ML IJ SOLN
100.0000 ug | INTRAMUSCULAR | Status: DC | PRN
  Administered 2017-02-18: 100 ug via INTRAVENOUS
  Filled 2017-02-17: qty 2

## 2017-02-17 MED ORDER — LACTATED RINGERS IV SOLN: 500.0000 mL | INTRAVENOUS | Status: DC | PRN

## 2017-02-17 MED ORDER — OXYCODONE-ACETAMINOPHEN 5-325 MG PO TABS: 2.0000 | ORAL_TABLET | ORAL | Status: DC | PRN

## 2017-02-17 MED ORDER — TERBUTALINE SULFATE 1 MG/ML IJ SOLN
0.2500 mg | Freq: Once | INTRAMUSCULAR | Status: DC | PRN
  Filled 2017-02-17: qty 1

## 2017-02-17 MED ORDER — ONDANSETRON HCL 4 MG/2ML IJ SOLN: 4.0000 mg | Freq: Four times a day (QID) | INTRAMUSCULAR | Status: DC | PRN

## 2017-02-17 MED ORDER — ACETAMINOPHEN 325 MG PO TABS
650.0000 mg | ORAL_TABLET | ORAL | Status: DC | PRN
  Filled 2017-02-17: qty 2

## 2017-02-17 NOTE — Anesthesia Pain Management Evaluation Note (Signed)
  [facility] Pain Management Visit Note  Patient: Rebecca Eaton, 21 y.o., female  "Hello I am a [facility] the [facility]. We have an anesthesia team available at all times to provide care throughout the hospital, including epidural management and anesthesia for C-section. I don't know your plan for the delivery whether it a natural birth, water birth, IV sedation, nitrous supplementation, doula or epidural, but we want to meet your pain goals."   1.Was your pain managed to your expectations on prior hospitalizations?   Yes   2.What is your expectation for pain management during this hospitalization?     Epidural  3.How can we help you reach that goal?   Record the patient's initial score and the patient's pain goal.   Pain: 0  Pain Goal: 8 [facility] wants you to be able to say your pain was always managed very well.  Laban EmperorMalinova,Marne Meline Hristova 02/17/2017

## 2017-02-17 NOTE — H&P (Signed)
LABOR AND DELIVERY ADMISSION HISTORY AND PHYSICAL NOTE  Dreanna Kyllo is a 21 y.o. female G2P1001 with IUP at [redacted]w[redacted]d by LMP presenting for SROM around 1700 of clear fluid.  She reports positive fetal movement. She reports no vaginal bleeding or discharge. Denies contractions.  Prenatal History/Complications: PNC at [facility] Pregnancy complications:  - low-risk pregnancy   Past Medical History: Past Medical History:  Diagnosis Date  . Asthma     Past Surgical History: Past Surgical History:  Procedure Laterality Date  . NO PAST SURGERIES      Obstetrical History: OB History    Gravida Para Term Preterm AB Living   2 1 1     1    SAB TAB Ectopic Multiple Live Births           1      Obstetric Comments   G1: 7lbs 8oz. No issues.       Social History: Social History   Social History  . Marital status: Single    Spouse name: N/A  . Number of children: N/A  . Years of education: N/A   Social History Main Topics  . Smoking status: Never Smoker  . Smokeless tobacco: Never Used  . Alcohol use No  . Drug use: No  . Sexual activity: Yes   Other Topics Concern  . None   Social History Narrative  . None    Family History: Family History  Problem Relation Age of Onset  . Heart disease Mother   . Heart disease Father   . Diabetes Paternal Grandmother     Allergies: Allergies  Allergen Reactions  . Peanut-Containing Drug Products Anaphylaxis  . Latex Rash    Prescriptions Prior to Admission  Medication Sig Dispense Refill Last Dose  . loratadine (CLARITIN) 10 MG tablet Take 10 mg by mouth daily as needed for allergies.   Taking  . Prenatal Vit-Fe Fumarate-FA (PRENATAL MULTIVITAMIN) TABS tablet Take 2 tablets by mouth daily at 12 noon.   Taking     Review of Systems  All systems reviewed and negative except as stated in HPI  Physical Exam Blood pressure 119/68, pulse 90, temperature 98.2 F (36.8 C), temperature source Oral, resp. rate 16, height 5\' 3"   (1.6 m), weight 77.6 kg (171 lb), last menstrual period 05/22/2016, SpO2 100 %. General appearance: alert, cooperative and no distress Lungs: clear to auscultation bilaterally Heart: regular rate and rhythm Abdomen: soft, non-tender; bowel sounds normal Extremities: No calf swelling or tenderness Presentation: cephalic (vertex) Fetal monitoring: 130/mod/+accel/-decel Uterine activity: occasional  Dilation: 2 Effacement (%): 60 Station: -1 Exam by:: Dr. Doroteo Glassman  Prenatal labs: ABO, Rh: O/Positive/-- (04/18 1519) Antibody: Negative (04/18 1519) Rubella: 2.87 (04/18 1519) RPR: Non Reactive (08/22 0936)  HBsAg: Negative (04/18 1519)  HIV:   NEGATIVE GC/Chlamydia: NEGATIVE GBS: Positive (10/15 1525)  2 hr Glucola: normal Genetic screening:  Low risk NIPS Anatomy US: normal  Prenatal Transfer Tool  Maternal Diabetes: No Genetic Screening: Normal Maternal Ultrasounds/Referrals: Normal Fetal Ultrasounds or other Referrals:  None Maternal Substance Abuse:  No Significant Maternal Medications:  None Significant Maternal Lab Results: Lab values include: Group B Strep positive  Results for orders placed or performed during the hospital encounter of 02/17/17 (from the past 24 hour(s))  POCT fern test   Collection Time: 02/17/17  6:30 PM  Result Value Ref Range   POCT Fern Test Positive = ruptured amniotic membanes   CBC   Collection Time: 02/17/17  6:40 PM  Result Value Ref  Range   WBC 13.9 (H) 4.0 - 10.5 K/uL   RBC 3.83 (L) 3.87 - 5.11 MIL/uL   Hemoglobin 10.3 (L) 12.0 - 15.0 g/dL   HCT 09.831.9 (L) 11.936.0 - 14.746.0 %   MCV 83.3 78.0 - 100.0 fL   MCH 26.9 26.0 - 34.0 pg   MCHC 32.3 30.0 - 36.0 g/dL   RDW 82.914.4 56.211.5 - 13.015.5 %   Platelets 334 150 - 400 K/uL    Patient Active Problem List   Diagnosis Date Noted  . Indication for care in labor or delivery 02/17/2017  . GBS (group B Streptococcus carrier), +RV culture, currently pregnant 02/10/2017  . Abnormal first trimester screen  09/02/2016  . Cervical dysplasia 08/13/2016  . Supervision of other normal pregnancy, antepartum 08/07/2016    Assessment: Presley Raddlentavia Karges is a [age] y.o. G2P1001 at 7075w5d here for SROM.  #Labor: SROM. Will observe initially to see if she goes into SOL. If not will augment her labor. #Pain: Per maternal request #FWB: Category 1 #ID: GBS positive; start PCN. Gc/Ch negative. #MOF: Breast #MOC: Depo #Circ:  n/a  Caryl AdaJazma Markala Sitts, DO OB Fellow

## 2017-02-17 NOTE — Progress Notes (Signed)
C/o vision issues, c/o headaches once in a while.

## 2017-02-17 NOTE — Progress Notes (Signed)
Labor Progress Note Presley Raddlentavia Mutz is a 21 y.o. G2P1001 at 4363w5d presented for SROM. S: No complaints  O:  BP 98/63   Pulse 85   Temp 98.1 F (36.7 C) (Oral)   Resp 16   Ht 5\' 3"  (1.6 m)   Wt 171 lb (77.6 kg)   LMP 05/22/2016   SpO2 100%   BMI 30.29 kg/m  EFM: 150/mod var/pos acels/ no decels  CVE: Dilation: 4 Effacement (%): 60 Cervical Position: Middle Station: -1 Presentation: Vertex Exam by:: Dr. Rachelle HoraMoss   A&P: 21 y.o. G2P1001 4063w5d here for SROM #Labor: Progressing well. IUPC placed. Continue pitocin until adequate contraction pattern. #Pain: planning for epidural #FWB: cat 1   Rolm BookbinderAmber Carizma Dunsworth, DO 9:54 PM

## 2017-02-17 NOTE — MAU Note (Signed)
Arrived via EMS +LOF , clear Denies vaginal bleeding

## 2017-02-17 NOTE — Progress Notes (Signed)
   PRENATAL VISIT NOTE  Subjective:  Rebecca Eaton is a 21 y.o. G2P1001 at 5587w5d being seen today for ongoing prenatal care.  She is currently monitored for the following issues for this low-risk pregnancy and has Supervision of other normal pregnancy, antepartum; Cervical dysplasia; Abnormal first trimester screen; and GBS (group B Streptococcus carrier), +RV culture, currently pregnant on her problem list.  Patient reports no complaints.  Contractions: Irregular. Vag. Bleeding: None.  Movement: Present. Denies leaking of fluid.   The following portions of the patient's history were reviewed and updated as appropriate: allergies, current medications, past family history, past medical history, past social history, past surgical history and problem list. Problem list updated.  Objective:   Vitals:   02/17/17 1349 02/17/17 1353  BP: (!) 130/97 (!) 108/56  Pulse: 95 92  Weight: 171 lb 3.2 oz (77.7 kg)     Fetal Status: Fetal Heart Rate (bpm): 125 Fundal Height: 36 cm Movement: Present  Presentation: Vertex  General:  Alert, oriented and cooperative. Patient is in no acute distress.  Skin: Skin is warm and dry. No rash noted.   Cardiovascular: Normal heart rate noted  Respiratory: Normal respiratory effort, no problems with respiration noted  Abdomen: Soft, gravid, appropriate for gestational age.  Pain/Pressure: Present     Pelvic: Cervical exam deferred        Extremities: Normal range of motion.  Edema: Trace  Mental Status:  Normal mood and affect. Normal behavior. Normal judgment and thought content.   Assessment and Plan:  Pregnancy: G2P1001 at 5687w5d  1. Supervision of other normal pregnancy, antepartum Continue routine prenatal care.   2. Abnormal first trimester screen Nml NIPS  3. GBS (group B Streptococcus carrier), +RV culture, currently pregnant Will need treatment in labor  Term labor symptoms and general obstetric precautions including but not limited to vaginal  bleeding, contractions, leaking of fluid and fetal movement were reviewed in detail with the patient. Please refer to After Visit Summary for other counseling recommendations.  Return in 1 week (on 02/24/2017).   Reva Boresanya S Shawntel Farnworth, MD

## 2017-02-17 NOTE — Patient Instructions (Signed)
 Third Trimester of Pregnancy The third trimester is from week 28 through week 40 (months 7 through 9). The third trimester is a time when the unborn baby (fetus) is growing rapidly. At the end of the ninth month, the fetus is about 20 inches in length and weighs 6-10 pounds. Body changes during your third trimester Your body will continue to go through many changes during pregnancy. The changes vary from woman to woman. During the third trimester:  Your weight will continue to increase. You can expect to gain 25-35 pounds (11-16 kg) by the end of the pregnancy.  You may begin to get stretch marks on your hips, abdomen, and breasts.  You may urinate more often because the fetus is moving lower into your pelvis and pressing on your bladder.  You may develop or continue to have heartburn. This is caused by increased hormones that slow down muscles in the digestive tract.  You may develop or continue to have constipation because increased hormones slow digestion and cause the muscles that push waste through your intestines to relax.  You may develop hemorrhoids. These are swollen veins (varicose veins) in the rectum that can itch or be painful.  You may develop swollen, bulging veins (varicose veins) in your legs.  You may have increased body aches in the pelvis, back, or thighs. This is due to weight gain and increased hormones that are relaxing your joints.  You may have changes in your hair. These can include thickening of your hair, rapid growth, and changes in texture. Some women also have hair loss during or after pregnancy, or hair that feels dry or thin. Your hair will most likely return to normal after your baby is born.  Your breasts will continue to grow and they will continue to become tender. A yellow fluid (colostrum) may leak from your breasts. This is the first milk you are producing for your baby.  Your belly button may stick out.  You may notice more swelling in your  hands, face, or ankles.  You may have increased tingling or numbness in your hands, arms, and legs. The skin on your belly may also feel numb.  You may feel short of breath because of your expanding uterus.  You may have more problems sleeping. This can be caused by the size of your belly, increased need to urinate, and an increase in your body's metabolism.  You may notice the fetus "dropping," or moving lower in your abdomen (lightening).  You may have increased vaginal discharge.  You may notice your joints feel loose and you may have pain around your pelvic bone.  What to expect at prenatal visits You will have prenatal exams every 2 weeks until week 36. Then you will have weekly prenatal exams. During a routine prenatal visit:  You will be weighed to make sure you and the baby are growing normally.  Your blood pressure will be taken.  Your abdomen will be measured to track your baby's growth.  The fetal heartbeat will be listened to.  Any test results from the previous visit will be discussed.  You may have a cervical check near your due date to see if your cervix has softened or thinned (effaced).  You will be tested for Group B streptococcus. This happens between 35 and 37 weeks.  Your health care provider may ask you:  What your birth plan is.  How you are feeling.  If you are feeling the baby move.  If you have   had any abnormal symptoms, such as leaking fluid, bleeding, severe headaches, or abdominal cramping.  If you are using any tobacco products, including cigarettes, chewing tobacco, and electronic cigarettes.  If you have any questions.  Other tests or screenings that may be performed during your third trimester include:  Blood tests that check for low iron levels (anemia).  Fetal testing to check the health, activity level, and growth of the fetus. Testing is done if you have certain medical conditions or if there are problems during the  pregnancy.  Nonstress test (NST). This test checks the health of your baby to make sure there are no signs of problems, such as the baby not getting enough oxygen. During this test, a belt is placed around your belly. The baby is made to move, and its heart rate is monitored during movement.  What is false labor? False labor is a condition in which you feel small, irregular tightenings of the muscles in the womb (contractions) that usually go away with rest, changing position, or drinking water. These are called Braxton Hicks contractions. Contractions may last for hours, days, or even weeks before true labor sets in. If contractions come at regular intervals, become more frequent, increase in intensity, or become painful, you should see your health care provider. What are the signs of labor?  Abdominal cramps.  Regular contractions that start at 10 minutes apart and become stronger and more frequent with time.  Contractions that start on the top of the uterus and spread down to the lower abdomen and back.  Increased pelvic pressure and dull back pain.  A watery or bloody mucus discharge that comes from the vagina.  Leaking of amniotic fluid. This is also known as your "water breaking." It could be a slow trickle or a gush. Let your health care provider know if it has a color or strange odor. If you have any of these signs, call your health care provider right away, even if it is before your due date. Follow these instructions at home: Medicines  Follow your health care provider's instructions regarding medicine use. Specific medicines may be either safe or unsafe to take during pregnancy.  Take a prenatal vitamin that contains at least 600 micrograms (mcg) of folic acid.  If you develop constipation, try taking a stool softener if your health care provider approves. Eating and drinking  Eat a balanced diet that includes fresh fruits and vegetables, whole grains, good sources of protein  such as meat, eggs, or tofu, and low-fat dairy. Your health care provider will help you determine the amount of weight gain that is right for you.  Avoid raw meat and uncooked cheese. These carry germs that can cause birth defects in the baby.  If you have low calcium intake from food, talk to your health care provider about whether you should take a daily calcium supplement.  Eat four or five small meals rather than three large meals a day.  Limit foods that are high in fat and processed sugars, such as fried and sweet foods.  To prevent constipation: ? Drink enough fluid to keep your urine clear or pale yellow. ? Eat foods that are high in fiber, such as fresh fruits and vegetables, whole grains, and beans. Activity  Exercise only as directed by your health care provider. Most women can continue their usual exercise routine during pregnancy. Try to exercise for 30 minutes at least 5 days a week. Stop exercising if you experience uterine contractions.  Avoid   heavy lifting.  Do not exercise in extreme heat or humidity, or at high altitudes.  Wear low-heel, comfortable shoes.  Practice good posture.  You may continue to have sex unless your health care provider tells you otherwise. Relieving pain and discomfort  Take frequent breaks and rest with your legs elevated if you have leg cramps or low back pain.  Take warm sitz baths to soothe any pain or discomfort caused by hemorrhoids. Use hemorrhoid cream if your health care provider approves.  Wear a good support bra to prevent discomfort from breast tenderness.  If you develop varicose veins: ? Wear support pantyhose or compression stockings as told by your healthcare provider. ? Elevate your feet for 15 minutes, 3-4 times a day. Prenatal care  Write down your questions. Take them to your prenatal visits.  Keep all your prenatal visits as told by your health care provider. This is important. Safety  Wear your seat belt at  all times when driving.  Make a list of emergency phone numbers, including numbers for family, friends, the hospital, and police and fire departments. General instructions  Avoid cat litter boxes and soil used by cats. These carry germs that can cause birth defects in the baby. If you have a cat, ask someone to clean the litter box for you.  Do not travel far distances unless it is absolutely necessary and only with the approval of your health care provider.  Do not use hot tubs, steam rooms, or saunas.  Do not drink alcohol.  Do not use any products that contain nicotine or tobacco, such as cigarettes and e-cigarettes. If you need help quitting, ask your health care provider.  Do not use any medicinal herbs or unprescribed drugs. These chemicals affect the formation and growth of the baby.  Do not douche or use tampons or scented sanitary pads.  Do not cross your legs for long periods of time.  To prepare for the arrival of your baby: ? Take prenatal classes to understand, practice, and ask questions about labor and delivery. ? Make a trial run to the hospital. ? Visit the hospital and tour the maternity area. ? Arrange for maternity or paternity leave through employers. ? Arrange for family and friends to take care of pets while you are in the hospital. ? Purchase a rear-facing car seat and make sure you know how to install it in your car. ? Pack your hospital bag. ? Prepare the baby's nursery. Make sure to remove all pillows and stuffed animals from the baby's crib to prevent suffocation.  Visit your dentist if you have not gone during your pregnancy. Use a soft toothbrush to brush your teeth and be gentle when you floss. Contact a health care provider if:  You are unsure if you are in labor or if your water has broken.  You become dizzy.  You have mild pelvic cramps, pelvic pressure, or nagging pain in your abdominal area.  You have lower back pain.  You have persistent  nausea, vomiting, or diarrhea.  You have an unusual or bad smelling vaginal discharge.  You have pain when you urinate. Get help right away if:  Your water breaks before 37 weeks.  You have regular contractions less than 5 minutes apart before 37 weeks.  You have a fever.  You are leaking fluid from your vagina.  You have spotting or bleeding from your vagina.  You have severe abdominal pain or cramping.  You have rapid weight loss or weight   gain.  You have shortness of breath with chest pain.  You notice sudden or extreme swelling of your face, hands, ankles, feet, or legs.  Your baby makes fewer than 10 movements in 2 hours.  You have severe headaches that do not go away when you take medicine.  You have vision changes. Summary  The third trimester is from week 28 through week 40, months 7 through 9. The third trimester is a time when the unborn baby (fetus) is growing rapidly.  During the third trimester, your discomfort may increase as you and your baby continue to gain weight. You may have abdominal, leg, and back pain, sleeping problems, and an increased need to urinate.  During the third trimester your breasts will keep growing and they will continue to become tender. A yellow fluid (colostrum) may leak from your breasts. This is the first milk you are producing for your baby.  False labor is a condition in which you feel small, irregular tightenings of the muscles in the womb (contractions) that eventually go away. These are called Braxton Hicks contractions. Contractions may last for hours, days, or even weeks before true labor sets in.  Signs of labor can include: abdominal cramps; regular contractions that start at 10 minutes apart and become stronger and more frequent with time; watery or bloody mucus discharge that comes from the vagina; increased pelvic pressure and dull back pain; and leaking of amniotic fluid. This information is not intended to replace advice  given to you by your health care provider. Make sure you discuss any questions you have with your health care provider. Document Released: 04/02/2001 Document Revised: 09/14/2015 Document Reviewed: 06/09/2012 [employer] Interactive Patient Education  2017 [employer]   Breastfeeding Deciding to breastfeed is one of the best choices you can make for you and your baby. A change in hormones during pregnancy causes your breast tissue to grow and increases the number and size of your milk ducts. These hormones also allow proteins, sugars, and fats from your blood supply to make breast milk in your milk-producing glands. Hormones prevent breast milk from being released before your baby is born as well as prompt milk flow after birth. Once breastfeeding has begun, thoughts of your baby, as well as his or her sucking or crying, can stimulate the release of milk from your milk-producing glands. Benefits of breastfeeding For Your Baby  Your first milk (colostrum) helps your baby's digestive system function better.  There are antibodies in your milk that help your baby fight off infections.  Your baby has a lower incidence of asthma, allergies, and sudden infant death syndrome.  The nutrients in breast milk are better for your baby than infant formulas and are designed uniquely for your baby's needs.  Breast milk improves your baby's brain development.  Your baby is less likely to develop other conditions, such as childhood obesity, asthma, or type 2 diabetes mellitus.  For You  Breastfeeding helps to create a very special bond between you and your baby.  Breastfeeding is convenient. Breast milk is always available at the correct temperature and costs nothing.  Breastfeeding helps to burn calories and helps you lose the weight gained during pregnancy.  Breastfeeding makes your uterus contract to its prepregnancy size faster and slows bleeding (lochia) after you give birth.  Breastfeeding helps  to lower your risk of developing type 2 diabetes mellitus, osteoporosis, and breast or ovarian cancer later in life.  Signs that your baby is hungry Early Signs of Hunger    Increased alertness or activity.  Stretching.  Movement of the head from side to side.  Movement of the head and opening of the mouth when the corner of the mouth or cheek is stroked (rooting).  Increased sucking sounds, smacking lips, cooing, sighing, or squeaking.  Hand-to-mouth movements.  Increased sucking of fingers or hands.  Late Signs of Hunger  Fussing.  Intermittent crying.  Extreme Signs of Hunger Signs of extreme hunger will require calming and consoling before your baby will be able to breastfeed successfully. Do not wait for the following signs of extreme hunger to occur before you initiate breastfeeding:  Restlessness.  A loud, strong cry.  Screaming.  Breastfeeding basics Breastfeeding Initiation  Find a comfortable place to sit or lie down, with your neck and back well supported.  Place a pillow or rolled up blanket under your baby to bring him or her to the level of your breast (if you are seated). Nursing pillows are specially designed to help support your arms and your baby while you breastfeed.  Make sure that your baby's abdomen is facing your abdomen.  Gently massage your breast. With your fingertips, massage from your chest wall toward your nipple in a circular motion. This encourages milk flow. You may need to continue this action during the feeding if your milk flows slowly.  Support your breast with 4 fingers underneath and your thumb above your nipple. Make sure your fingers are well away from your nipple and your baby's mouth.  Stroke your baby's lips gently with your finger or nipple.  When your baby's mouth is open wide enough, quickly bring your baby to your breast, placing your entire nipple and as much of the colored area around your nipple (areola) as possible into  your baby's mouth. ? More areola should be visible above your baby's upper lip than below the lower lip. ? Your baby's tongue should be between his or her lower gum and your breast.  Ensure that your baby's mouth is correctly positioned around your nipple (latched). Your baby's lips should create a seal on your breast and be turned out (everted).  It is common for your baby to suck about 2-3 minutes in order to start the flow of breast milk.  Latching Teaching your baby how to latch on to your breast properly is very important. An improper latch can cause nipple pain and decreased milk supply for you and poor weight gain in your baby. Also, if your baby is not latched onto your nipple properly, he or she may swallow some air during feeding. This can make your baby fussy. Burping your baby when you switch breasts during the feeding can help to get rid of the air. However, teaching your baby to latch on properly is still the best way to prevent fussiness from swallowing air while breastfeeding. Signs that your baby has successfully latched on to your nipple:  Silent tugging or silent sucking, without causing you pain.  Swallowing heard between every 3-4 sucks.  Muscle movement above and in front of his or her ears while sucking.  Signs that your baby has not successfully latched on to nipple:  Sucking sounds or smacking sounds from your baby while breastfeeding.  Nipple pain.  If you think your baby has not latched on correctly, slip your finger into the corner of your baby's mouth to break the suction and place it between your baby's gums. Attempt breastfeeding initiation again. Signs of Successful Breastfeeding Signs from your baby:  A   gradual decrease in the number of sucks or complete cessation of sucking.  Falling asleep.  Relaxation of his or her body.  Retention of a small amount of milk in his or her mouth.  Letting go of your breast by himself or herself.  Signs from  you:  Breasts that have increased in firmness, weight, and size 1-3 hours after feeding.  Breasts that are softer immediately after breastfeeding.  Increased milk volume, as well as a change in milk consistency and color by the fifth day of breastfeeding.  Nipples that are not sore, cracked, or bleeding.  Signs That Your Baby is Getting Enough Milk  Wetting at least 1-2 diapers during the first 24 hours after birth.  Wetting at least 5-6 diapers every 24 hours for the first week after birth. The urine should be clear or pale yellow by 5 days after birth.  Wetting 6-8 diapers every 24 hours as your baby continues to grow and develop.  At least 3 stools in a 24-hour period by age 5 days. The stool should be soft and yellow.  At least 3 stools in a 24-hour period by age 7 days. The stool should be seedy and yellow.  No loss of weight greater than 10% of birth weight during the first 3 days of age.  Average weight gain of 4-7 ounces (113-198 g) per week after age 4 days.  Consistent daily weight gain by age 5 days, without weight loss after the age of 2 weeks.  After a feeding, your baby may spit up a small amount. This is common. Breastfeeding frequency and duration Frequent feeding will help you make more milk and can prevent sore nipples and breast engorgement. Breastfeed when you feel the need to reduce the fullness of your breasts or when your baby shows signs of hunger. This is called "breastfeeding on demand." Avoid introducing a pacifier to your baby while you are working to establish breastfeeding (the first 4-6 weeks after your baby is born). After this time you may choose to use a pacifier. Research has shown that pacifier use during the first year of a baby's life decreases the risk of sudden infant death syndrome (SIDS). Allow your baby to feed on each breast as long as he or she wants. Breastfeed until your baby is finished feeding. When your baby unlatches or falls asleep  while feeding from the first breast, offer the second breast. Because newborns are often sleepy in the first few weeks of life, you may need to awaken your baby to get him or her to feed. Breastfeeding times will vary from baby to baby. However, the following rules can serve as a guide to help you ensure that your baby is properly fed:  Newborns (babies 4 weeks of age or younger) may breastfeed every 1-3 hours.  Newborns should not go longer than 3 hours during the day or 5 hours during the night without breastfeeding.  You should breastfeed your baby a minimum of 8 times in a 24-hour period until you begin to introduce solid foods to your baby at around 6 months of age.  Breast milk pumping Pumping and storing breast milk allows you to ensure that your baby is exclusively fed your breast milk, even at times when you are unable to breastfeed. This is especially important if you are going back to work while you are still breastfeeding or when you are not able to be present during feedings. Your lactation consultant can give you guidelines on how   long it is safe to store breast milk. A breast pump is a machine that allows you to pump milk from your breast into a sterile bottle. The pumped breast milk can then be stored in a refrigerator or freezer. Some breast pumps are operated by hand, while others use electricity. Ask your lactation consultant which type will work best for you. Breast pumps can be purchased, but some hospitals and breastfeeding support groups lease breast pumps on a monthly basis. A lactation consultant can teach you how to hand express breast milk, if you prefer not to use a pump. Caring for your breasts while you breastfeed Nipples can become dry, cracked, and sore while breastfeeding. The following recommendations can help keep your breasts moisturized and healthy:  Avoid using soap on your nipples.  Wear a supportive bra. Although not required, special nursing bras and tank  tops are designed to allow access to your breasts for breastfeeding without taking off your entire bra or top. Avoid wearing underwire-style bras or extremely tight bras.  Air dry your nipples for 3-4minutes after each feeding.  Use only cotton bra pads to absorb leaked breast milk. Leaking of breast milk between feedings is normal.  Use lanolin on your nipples after breastfeeding. Lanolin helps to maintain your skin's normal moisture barrier. If you use pure lanolin, you do not need to wash it off before feeding your baby again. Pure lanolin is not toxic to your baby. You may also hand express a few drops of breast milk and gently massage that milk into your nipples and allow the milk to air dry.  In the first few weeks after giving birth, some women experience extremely full breasts (engorgement). Engorgement can make your breasts feel heavy, warm, and tender to the touch. Engorgement peaks within 3-5 days after you give birth. The following recommendations can help ease engorgement:  Completely empty your breasts while breastfeeding or pumping. You may want to start by applying warm, moist heat (in the shower or with warm water-soaked hand towels) just before feeding or pumping. This increases circulation and helps the milk flow. If your baby does not completely empty your breasts while breastfeeding, pump any extra milk after he or she is finished.  Wear a snug bra (nursing or regular) or tank top for 1-2 days to signal your body to slightly decrease milk production.  Apply ice packs to your breasts, unless this is too uncomfortable for you.  Make sure that your baby is latched on and positioned properly while breastfeeding.  If engorgement persists after 48 hours of following these recommendations, contact your health care provider or a lactation consultant. Overall health care recommendations while breastfeeding  Eat healthy foods. Alternate between meals and snacks, eating 3 of each per  day. Because what you eat affects your breast milk, some of the foods may make your baby more irritable than usual. Avoid eating these foods if you are sure that they are negatively affecting your baby.  Drink milk, fruit juice, and water to satisfy your thirst (about 10 glasses a day).  Rest often, relax, and continue to take your prenatal vitamins to prevent fatigue, stress, and anemia.  Continue breast self-awareness checks.  Avoid chewing and smoking tobacco. Chemicals from cigarettes that pass into breast milk and exposure to secondhand smoke may harm your baby.  Avoid alcohol and drug use, including marijuana. Some medicines that may be harmful to your baby can pass through breast milk. It is important to ask your health care   provider before taking any medicine, including all over-the-counter and prescription medicine as well as vitamin and herbal supplements. It is possible to become pregnant while breastfeeding. If birth control is desired, ask your health care provider about options that will be safe for your baby. Contact a health care provider if:  You feel like you want to stop breastfeeding or have become frustrated with breastfeeding.  You have painful breasts or nipples.  Your nipples are cracked or bleeding.  Your breasts are red, tender, or warm.  You have a swollen area on either breast.  You have a fever or chills.  You have nausea or vomiting.  You have drainage other than breast milk from your nipples.  Your breasts do not become full before feedings by the fifth day after you give birth.  You feel sad and depressed.  Your baby is too sleepy to eat well.  Your baby is having trouble sleeping.  Your baby is wetting less than 3 diapers in a 24-hour period.  Your baby has less than 3 stools in a 24-hour period.  Your baby's skin or the white part of his or her eyes becomes yellow.  Your baby is not gaining weight by 5 days of age. Get help right away  if:  Your baby is overly tired (lethargic) and does not want to wake up and feed.  Your baby develops an unexplained fever. This information is not intended to replace advice given to you by your health care provider. Make sure you discuss any questions you have with your health care provider. Document Released: 04/08/2005 Document Revised: 09/20/2015 Document Reviewed: 09/30/2012 Elsevier Interactive Patient Education  2017 Elsevier Inc.  

## 2017-02-18 ENCOUNTER — Encounter: Payer: Self-pay | Admitting: Family Medicine

## 2017-02-18 DIAGNOSIS — Z3A38 38 weeks gestation of pregnancy: Secondary | ICD-10-CM

## 2017-02-18 DIAGNOSIS — O99824 Streptococcus B carrier state complicating childbirth: Secondary | ICD-10-CM

## 2017-02-18 LAB — RPR: RPR: NONREACTIVE

## 2017-02-18 LAB — ABO/RH: ABO/RH(D): O POS

## 2017-02-18 MED ORDER — DIBUCAINE 1 % RE OINT: 1.0000 "application " | TOPICAL_OINTMENT | RECTAL | Status: DC | PRN

## 2017-02-18 MED ORDER — DIPHENHYDRAMINE HCL 25 MG PO CAPS: 25.0000 mg | ORAL_CAPSULE | Freq: Four times a day (QID) | ORAL | Status: DC | PRN

## 2017-02-18 MED ORDER — SENNOSIDES-DOCUSATE SODIUM 8.6-50 MG PO TABS
2.0000 | ORAL_TABLET | ORAL | Status: DC
  Administered 2017-02-18: 2 via ORAL
  Filled 2017-02-18: qty 2

## 2017-02-18 MED ORDER — METHYLERGONOVINE MALEATE 0.2 MG/ML IJ SOLN
INTRAMUSCULAR | Status: AC
  Filled 2017-02-18: qty 1

## 2017-02-18 MED ORDER — IBUPROFEN 600 MG PO TABS
600.0000 mg | ORAL_TABLET | Freq: Four times a day (QID) | ORAL | Status: DC
  Administered 2017-02-18 – 2017-02-19 (×6): 600 mg via ORAL
  Filled 2017-02-18 (×6): qty 1

## 2017-02-18 MED ORDER — MISOPROSTOL 200 MCG PO TABS
ORAL_TABLET | ORAL | Status: AC
  Administered 2017-02-18: 800 ug
  Filled 2017-02-18: qty 4

## 2017-02-18 MED ORDER — TETANUS-DIPHTH-ACELL PERTUSSIS 5-2.5-18.5 LF-MCG/0.5 IM SUSP: 0.5000 mL | Freq: Once | INTRAMUSCULAR | Status: DC

## 2017-02-18 MED ORDER — BENZOCAINE-MENTHOL 20-0.5 % EX AERO
1.0000 | INHALATION_SPRAY | CUTANEOUS | Status: DC | PRN
Start: 2017-02-18 — End: 2017-02-19
  Administered 2017-02-18: 1 via TOPICAL
  Filled 2017-02-18: qty 56

## 2017-02-18 MED ORDER — ACETAMINOPHEN 325 MG PO TABS
650.0000 mg | ORAL_TABLET | ORAL | Status: DC | PRN
  Administered 2017-02-18: 650 mg via ORAL

## 2017-02-18 MED ORDER — ONDANSETRON HCL 4 MG/2ML IJ SOLN: 4.0000 mg | INTRAMUSCULAR | Status: DC | PRN

## 2017-02-18 MED ORDER — PRENATAL MULTIVITAMIN CH
1.0000 | ORAL_TABLET | Freq: Every day | ORAL | Status: DC
  Administered 2017-02-18 – 2017-02-19 (×2): 1 via ORAL
  Filled 2017-02-18 (×3): qty 1

## 2017-02-18 MED ORDER — COCONUT OIL OIL: 1.0000 "application " | TOPICAL_OIL | Status: DC | PRN

## 2017-02-18 MED ORDER — WITCH HAZEL-GLYCERIN EX PADS: 1.0000 "application " | MEDICATED_PAD | CUTANEOUS | Status: DC | PRN

## 2017-02-18 MED ORDER — ZOLPIDEM TARTRATE 5 MG PO TABS: 5.0000 mg | ORAL_TABLET | Freq: Every evening | ORAL | Status: DC | PRN

## 2017-02-18 MED ORDER — ONDANSETRON HCL 4 MG PO TABS: 4.0000 mg | ORAL_TABLET | ORAL | Status: DC | PRN

## 2017-02-18 MED ORDER — SIMETHICONE 80 MG PO CHEW: 80.0000 mg | CHEWABLE_TABLET | ORAL | Status: DC | PRN

## 2017-02-18 NOTE — Lactation Note (Signed)
This note was copied from a baby's chart. Lactation Consultation Note  Patient Name: Rebecca Eaton WUJWJ'XToday's Date: 02/18/2017 Reason for consult: Initial assessment  Initial visit at 17 hours of age.  Mom reports good feedings and RN reports mom has pain with latching on left breast.  Mom holding baby at right breast in modified cradle hold latching shallow to tip of nipple on and off.  Mom has easily expressed colostrum.  Lc assisted with cross cradle latch.  Mom has very large soft breasts needing support during feedings.  RN gave mom shells to wear in between feedings and hand pump to help evert nipples.  Baby latched with wide gape and strong sucking for about 15 minutes with some swallows audible.  Mom improving confidence with latching baby.  Mom encouraged to compress behind areola to allow baby to get a deep latch.  Baby stopped feeding and pulled off with copious amounts of white frothy nasal discharge.  Baby cleared nares well no bulb suction needed.  Baby would not suck well on gloved finger after this.  Baby is tongue thrusting and not extending tongue past lower gum ridge during sucking.  Mom burped baby and then re-latched and denies pain with feeding. [facility] by [facility]. [facility] resources given and discussed.  Encouraged to feed with early cues on demand.  Early newborn behavior discussed.  Hand expression demonstrated with colostrum visible.  Mom to call for assist as needed.           Maternal Data Has patient been taught Hand Expression?: Yes Does the patient have breastfeeding experience prior to this delivery?: Yes  Feeding Feeding Type: Breast Fed  LATCH Score Latch: Grasps breast easily, tongue down, lips flanged, rhythmical sucking.  Audible Swallowing: A few with stimulation  Type of Nipple: Flat  Comfort (Breast/Nipple): Soft / non-tender  Hold (Positioning): Assistance needed to correctly position infant at breast and maintain latch.  LATCH Score:  7  Interventions Interventions: Breast feeding basics reviewed;Assisted with latch;Skin to skin;Breast massage;Hand express;Breast compression;Hand pump;Shells;Expressed milk;Support pillows;Position options;Adjust position  Lactation Tools Discussed/Used Tools: Shells;Pump Shell Type: Inverted Breast pump type: Manual WIC Program: Yes Initiated by:: RN Date initiated:: 02/18/17   Consult Status Consult Status: Follow-up Date: 02/19/17 Follow-up type: In-patient    Franz DellJana Teddie Mehta 02/18/2017, 7:27 PM

## 2017-02-19 LAB — CBC
HCT: 27.2 % — ABNORMAL LOW (ref 36.0–46.0)
HEMOGLOBIN: 9 g/dL — AB (ref 12.0–15.0)
MCH: 27.3 pg (ref 26.0–34.0)
MCHC: 33.1 g/dL (ref 30.0–36.0)
MCV: 82.4 fL (ref 78.0–100.0)
PLATELETS: 301 10*3/uL (ref 150–400)
RBC: 3.3 MIL/uL — ABNORMAL LOW (ref 3.87–5.11)
RDW: 14.4 % (ref 11.5–15.5)
WBC: 15.4 10*3/uL — ABNORMAL HIGH (ref 4.0–10.5)

## 2017-02-19 MED ORDER — IBUPROFEN 600 MG PO TABS: 600.0000 mg | ORAL_TABLET | Freq: Four times a day (QID) | ORAL | 0 refills | Status: DC

## 2017-02-19 NOTE — Lactation Note (Signed)
This note was copied from a baby's chart. Lactation Consultation Note  Patient Name: Rebecca Eaton WUJWJ'XToday's Date: 02/19/2017 Reason for consult: Follow-up assessment;Infant weight loss Baby is 32 hours old, and per mom feeding much better .  Baby waking up as LC entered the room, LC changed large wet diaper,  And  placed baby STS on the right breast for football  Position, baby opened wide and latched to a compressible areola , depth achieved  And fed for 15 mins with multiple swallows.  Sore nipples and engorgement prevention and tx reviewed.  Nutritive feeding vs non - nutritive feeding patterns and to watch for hanging out when latched.  Importance of breast compressions with latch Mom has a hand pump, shells , and LC increased flange size for when milk comes in to #27.  Mother informed of post-discharge support and given phone number to the lactation department, including services for phone call assistance; out-patient appointments; and breastfeeding support group. List of other breastfeeding resources in the community given in the handout. Encouraged mother to call for problems or concerns related to breastfeeding.   Maternal Data Has patient been taught Hand Expression?: Yes  Feeding Feeding Type: Breast Fed Length of feed:  (multiple swallows, increased with breast compressions )  LATCH Score Latch: Grasps breast easily, tongue down, lips flanged, rhythmical sucking.  Audible Swallowing: Spontaneous and intermittent  Type of Nipple: Everted at rest and after stimulation  Comfort (Breast/Nipple): Filling, red/small blisters or bruises, mild/mod discomfort  Hold (Positioning): Assistance needed to correctly position infant at breast and maintain latch.  LATCH Score: 8  Interventions Interventions: Breast feeding basics reviewed;Assisted with latch;Skin to skin;Breast massage;Position options;Hand express;Support pillows;Adjust position;Breast compression;Shells;Hand  pump  Lactation Tools Discussed/Used Tools: Pump;Shells;Flanges Flange Size: 27 Shell Type: Inverted Breast pump type: Manual Pump Review: Setup, frequency, and cleaning;Milk Storage Initiated by:: reviewed / MAI  Date initiated:: 02/19/17   Consult Status Consult Status: Complete Date: 02/19/17 Follow-up type: In-patient    Matilde SprangMargaret Ann Zuhair Lariccia 02/19/2017, 10:19 AM

## 2017-02-19 NOTE — Discharge Instructions (Signed)

## 2017-02-19 NOTE — Discharge Summary (Signed)
OB Discharge Summary     Patient Name: Rebecca Eaton DOB: [birth year] MRN: [id number] Date of admission: 02/17/2017  Delivering MD: Rolm Bookbinder )  Date of discharge: 02/19/2017    Admitting diagnosis: SROM, pregnancy at [redacted] weeks gestation Intrauterine pregnancy: [redacted]w[redacted]d    Secondary diagnosis:  Active Problems:   Patient Active Problem List   Diagnosis Date Noted  . SVD (spontaneous vaginal delivery) 02/18/2017  . Abnormal first trimester screen 09/02/2016  . Cervical dysplasia 08/13/2016    Additional problems: none     Discharge diagnosis: Term Pregnancy Delivered                                                                                                Post partum procedures:none  Augmentation: Pitocin  Complications: None  Hospital course:  Onset of Labor With Vaginal Delivery     21 y.o. yo [id number] at [redacted]w[redacted]d was admitted in Latent Labor on 02/17/2017. Patient had an uncomplicated labor course as follows:  Membrane Rupture Time/Date: 5:23 PM ,02/17/2017   Intrapartum Procedures: Episiotomy: None [1]                                         Lacerations:  Periurethral [8]  Patient had a delivery of a Viable infant. 02/18/2017  Information for the patient's newborn:  Genavie, Boettger [[id number]]  Delivery Method: Vaginal, Spontaneous Delivery (Filed from Delivery Summary)    Pateint had an uncomplicated postpartum course.  She is ambulating, tolerating a regular diet, passing flatus, and urinating well. Patient is discharged home in stable condition on 02/19/17.   Physical exam  Vitals:   02/18/17 2357 02/19/17 0532  BP: 111/70 107/63  Pulse: (!) 58 64  Resp: 18 18  Temp: 97.7 F (36.5 C) 97.6 F (36.4 C)  SpO2:      General: alert, cooperative and no distress Lochia: appropriate Uterine Fundus: firm Incision: N/A DVT Evaluation: No evidence of DVT seen on physical exam.  Labs: Results for orders placed or performed during the hospital encounter  of 02/17/17 (from the past 24 hour(s))  CBC     Status: Abnormal   Collection Time: 02/19/17  5:52 AM  Result Value Ref Range   WBC 15.4 (H) 4.0 - 10.5 K/uL   RBC 3.30 (L) 3.87 - 5.11 MIL/uL   Hemoglobin 9.0 (L) 12.0 - 15.0 g/dL   HCT 96.2 (L) 95.2 - 84.1 %   MCV 82.4 78.0 - 100.0 fL   MCH 27.3 26.0 - 34.0 pg   MCHC 33.1 30.0 - 36.0 g/dL   RDW 32.4 40.1 - 02.7 %   Platelets 301 150 - 400 K/uL     Discharge instruction: per After Visit Summary and "Baby and Me Booklet".  After visit meds:  Allergies  Allergen Reactions  . Peanut-Containing Drug Products Anaphylaxis  . Latex Rash    Allergies as of 02/19/2017      Reactions   Peanut-containing Drug Products Anaphylaxis   Latex Rash  Medication List    TAKE these medications   ibuprofen 600 MG tablet Commonly known as:  ADVIL,MOTRIN Take 1 tablet (600 mg total) by mouth every 6 (six) hours.   loratadine 10 MG tablet Commonly known as:  CLARITIN Take 10 mg by mouth daily as needed for allergies.   prenatal multivitamin Tabs tablet Take 2 tablets by mouth daily at 12 noon.        Diet: routine diet  Activity: Advance as tolerated. Pelvic rest for 6 weeks.   Outpatient follow up:4 weeks    Postpartum contraception: Depo Provera  Newborn Data: APGAR (1 MIN): 8   APGAR (5 MINS): 9      Baby Feeding: Breast Disposition:home with mother  Rolm Bookbindermber Marcine Gadway, DO  02/19/2017

## 2017-02-24 ENCOUNTER — Encounter: Payer: Medicaid Other | Admitting: Advanced Practice Midwife

## 2017-03-03 ENCOUNTER — Encounter: Payer: Medicaid Other | Admitting: Advanced Practice Midwife

## 2017-04-02 ENCOUNTER — Ambulatory Visit: Payer: Medicaid Other | Admitting: Medical

## 2017-06-27 ENCOUNTER — Telehealth: Payer: Self-pay | Admitting: Internal Medicine

## 2017-06-27 NOTE — Telephone Encounter (Signed)
I saw Ms. Renz's 904 mo old daughter for a routine well child check today.  Ms. Pricilla Holmucker seemed different from our last visit, but when asked she reported "everything was fine" and did not seem interested in talking about what every was bothering her.  Near the end of the visit, I asked her to complete [place] postnatal depression scale which she did.  She was unable to stay afterwards to discuss this as I was in a different room with the patient.  Her [place] score was 11, she denied any SI or HI.  After the clinic visit I spoke to her over the phone regarding my concerns about her.  She does report to me that she has been stressed out recently.  She reports that she has been doing some renovations at her home.  She has a history of anxiety and depression.  She was diagnosed with this in middle school.  She reports that she was only on a medication to help her sleep but was not on a daily medication for her mood.  This was because her parents were concerned about medication at that time.  She currently does not have a PCP.  [facility] but did not go to her 6 weeks postpartum visit.  She is willing to come here to see me to discuss her stress and mood.  I made an appointment for her this coming Wednesday at 10:30 AM.  Will send a message to front desk staff to inform of new patient coming in.  She denied that she has any SI or HI over the phone as well.

## 2017-07-02 ENCOUNTER — Encounter: Payer: Self-pay | Admitting: Licensed Clinical Social Worker

## 2017-07-02 ENCOUNTER — Other Ambulatory Visit: Payer: Self-pay

## 2017-07-02 ENCOUNTER — Ambulatory Visit (INDEPENDENT_AMBULATORY_CARE_PROVIDER_SITE_OTHER): Payer: Self-pay | Admitting: Internal Medicine

## 2017-07-02 ENCOUNTER — Encounter: Payer: Self-pay | Admitting: Internal Medicine

## 2017-07-02 VITALS — BP 102/66 | HR 92 | Temp 98.0°F | Ht 64.0 in | Wt 150.0 lb

## 2017-07-02 DIAGNOSIS — F341 Dysthymic disorder: Secondary | ICD-10-CM

## 2017-07-02 MED ORDER — SERTRALINE HCL 50 MG PO TABS: 50.0000 mg | ORAL_TABLET | Freq: Every day | ORAL | 0 refills | Status: DC
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
## 2017-07-02 NOTE — Patient Instructions (Signed)
We will start Sertraline 50mg  daily.  Please follow up with me in 2 weeks.
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
## 2017-07-03 ENCOUNTER — Encounter: Payer: Self-pay | Admitting: Internal Medicine

## 2017-07-08 ENCOUNTER — Telehealth: Payer: Self-pay | Admitting: Licensed Clinical Social Worker

## 2017-07-08 NOTE — Progress Notes (Signed)
Service : Integrated Behavioral Health F/U Call   F/U call to patient reference interventions discussed and resources provided during warm handoff from PCP.  Patient states she is doing well.  She started taking zoloft as prescribed by Dr. Waneta MartinsGunadass on 07/02/17.  LCSW reminded patient to schedule F/U visit, [facility] and [facility] visit on the same day. Patient appreciative of F/U call and will call to schedule the appointment.  Sammuel Hineseborah Kylie Gros, LCSW Licensed Clinical Social Worker [facility]   [phone] 3:36 PM

## 2017-07-20 NOTE — Progress Notes (Deleted)
   [facility] Phone: [phone]   Date of Visit: 07/21/2017   HPI:  ***  ROS: See HPI.  PMFSH: ***  PHYSICAL EXAM: There were no vitals taken for this visit. Gen: *** HEENT: *** Heart: *** Lungs: *** Neuro: *** Ext: ***  ASSESSMENT/PLAN:  Health maintenance:  -***  No problem-specific Assessment & Plan notes found for this encounter.  FOLLOW UP: Follow up in *** for ***  Palma HolterKanishka G Gunadasa, MD PGY 2 [facility]

## 2017-07-21 ENCOUNTER — Ambulatory Visit: Payer: Medicaid Other | Admitting: Internal Medicine

## 2017-09-09 ENCOUNTER — Emergency Department (HOSPITAL_COMMUNITY)
Admission: EM | Admit: 2017-09-09 | Discharge: 2017-09-10 | Disposition: A | Discharge status: Home / Self Care | Payer: Medicaid Other | Attending: Emergency Medicine | Admitting: Emergency Medicine

## 2017-09-09 DIAGNOSIS — R109 Unspecified abdominal pain: Secondary | ICD-10-CM | POA: Insufficient documentation

## 2017-09-09 DIAGNOSIS — Z5321 Procedure and treatment not carried out due to patient leaving prior to being seen by health care provider: Secondary | ICD-10-CM | POA: Insufficient documentation

## 2017-09-25 ENCOUNTER — Emergency Department (HOSPITAL_COMMUNITY)
Admission: EM | Admit: 2017-09-25 | Discharge: 2017-09-26 | Discharge status: Against Medical Advice | Payer: Medicaid Other | Attending: Emergency Medicine | Admitting: Emergency Medicine

## 2017-09-25 ENCOUNTER — Other Ambulatory Visit: Payer: Self-pay

## 2017-09-25 ENCOUNTER — Encounter (HOSPITAL_COMMUNITY): Payer: Self-pay | Admitting: Emergency Medicine

## 2017-09-25 DIAGNOSIS — Y939 Activity, unspecified: Secondary | ICD-10-CM | POA: Insufficient documentation

## 2017-09-25 DIAGNOSIS — Z5321 Procedure and treatment not carried out due to patient leaving prior to being seen by health care provider: Secondary | ICD-10-CM | POA: Insufficient documentation

## 2017-09-25 DIAGNOSIS — X58XXXA Exposure to other specified factors, initial encounter: Secondary | ICD-10-CM | POA: Insufficient documentation

## 2017-09-25 DIAGNOSIS — Y929 Unspecified place or not applicable: Secondary | ICD-10-CM | POA: Insufficient documentation

## 2017-09-25 DIAGNOSIS — Y999 Unspecified external cause status: Secondary | ICD-10-CM | POA: Insufficient documentation

## 2017-09-25 NOTE — ED Notes (Addendum)
Pt called for room, no response from lobby 
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
## 2017-09-26 NOTE — ED Notes (Signed)
Patient called and no answer. 

## 2017-09-29 ENCOUNTER — Emergency Department (HOSPITAL_COMMUNITY): Payer: Medicaid Other

## 2017-09-29 ENCOUNTER — Other Ambulatory Visit: Payer: Self-pay

## 2017-09-29 ENCOUNTER — Emergency Department (HOSPITAL_COMMUNITY)
Admission: EM | Admit: 2017-09-29 | Discharge: 2017-09-29 | Disposition: A | Discharge status: Home / Self Care | Payer: Medicaid Other | Attending: Physician Assistant | Admitting: Physician Assistant

## 2017-09-29 ENCOUNTER — Encounter (HOSPITAL_COMMUNITY): Payer: Self-pay | Admitting: Emergency Medicine

## 2017-09-29 DIAGNOSIS — Z79899 Other long term (current) drug therapy: Secondary | ICD-10-CM | POA: Insufficient documentation

## 2017-09-29 DIAGNOSIS — Y929 Unspecified place or not applicable: Secondary | ICD-10-CM | POA: Insufficient documentation

## 2017-09-29 DIAGNOSIS — S0990XA Unspecified injury of head, initial encounter: Secondary | ICD-10-CM | POA: Insufficient documentation

## 2017-09-29 DIAGNOSIS — M549 Dorsalgia, unspecified: Secondary | ICD-10-CM | POA: Insufficient documentation

## 2017-09-29 DIAGNOSIS — M7918 Myalgia, other site: Secondary | ICD-10-CM | POA: Insufficient documentation

## 2017-09-29 DIAGNOSIS — Y939 Activity, unspecified: Secondary | ICD-10-CM | POA: Insufficient documentation

## 2017-09-29 DIAGNOSIS — N3000 Acute cystitis without hematuria: Secondary | ICD-10-CM | POA: Insufficient documentation

## 2017-09-29 DIAGNOSIS — R1084 Generalized abdominal pain: Secondary | ICD-10-CM

## 2017-09-29 DIAGNOSIS — Z91010 Allergy to peanuts: Secondary | ICD-10-CM | POA: Insufficient documentation

## 2017-09-29 DIAGNOSIS — J45909 Unspecified asthma, uncomplicated: Secondary | ICD-10-CM | POA: Insufficient documentation

## 2017-09-29 DIAGNOSIS — Y999 Unspecified external cause status: Secondary | ICD-10-CM | POA: Insufficient documentation

## 2017-09-29 DIAGNOSIS — Z91040 Latex allergy status: Secondary | ICD-10-CM | POA: Insufficient documentation

## 2017-09-29 LAB — CBC WITH DIFFERENTIAL/PLATELET
Basophils Absolute: 0 10*3/uL (ref 0.0–0.1)
Basophils Relative: 0 %
Eosinophils Absolute: 0 10*3/uL (ref 0.0–0.7)
Eosinophils Relative: 0 %
HEMATOCRIT: 36.6 % (ref 36.0–46.0)
HEMOGLOBIN: 11.5 g/dL — AB (ref 12.0–15.0)
LYMPHS PCT: 18 %
Lymphs Abs: 1.6 10*3/uL (ref 0.7–4.0)
MCH: 25.7 pg — ABNORMAL LOW (ref 26.0–34.0)
MCHC: 31.4 g/dL (ref 30.0–36.0)
MCV: 81.9 fL (ref 78.0–100.0)
MONO ABS: 0.9 10*3/uL (ref 0.1–1.0)
MONOS PCT: 9 %
NEUTROS ABS: 6.5 10*3/uL (ref 1.7–7.7)
Neutrophils Relative %: 73 %
Platelets: 392 10*3/uL (ref 150–400)
RBC: 4.47 MIL/uL (ref 3.87–5.11)
RDW: 16.1 % — AB (ref 11.5–15.5)
WBC: 9 10*3/uL (ref 4.0–10.5)

## 2017-09-29 LAB — URINALYSIS, ROUTINE W REFLEX MICROSCOPIC
BILIRUBIN URINE: NEGATIVE
Glucose, UA: NEGATIVE mg/dL
HGB URINE DIPSTICK: NEGATIVE
KETONES UR: 20 mg/dL — AB
Nitrite: POSITIVE — AB
PH: 5 (ref 5.0–8.0)
Protein, ur: 100 mg/dL — AB
Specific Gravity, Urine: 1.027 (ref 1.005–1.030)

## 2017-09-29 LAB — COMPREHENSIVE METABOLIC PANEL
ALT: 15 U/L (ref 14–54)
ANION GAP: 8 (ref 5–15)
AST: 27 U/L (ref 15–41)
Albumin: 4.5 g/dL (ref 3.5–5.0)
Alkaline Phosphatase: 70 U/L (ref 38–126)
BUN: 13 mg/dL (ref 6–20)
CO2: 25 mmol/L (ref 22–32)
Calcium: 9.3 mg/dL (ref 8.9–10.3)
Chloride: 108 mmol/L (ref 101–111)
Creatinine, Ser: 1.03 mg/dL — ABNORMAL HIGH (ref 0.44–1.00)
GFR calc Af Amer: >60 mL/min (ref >60)
GFR calc non Af Amer: >60 mL/min (ref >60)
GLUCOSE: 84 mg/dL (ref 65–99)
POTASSIUM: 3.8 mmol/L (ref 3.5–5.1)
SODIUM: 141 mmol/L (ref 135–145)
Total Bilirubin: 0.8 mg/dL (ref 0.3–1.2)
Total Protein: 8.5 g/dL — ABNORMAL HIGH (ref 6.5–8.1)

## 2017-09-29 LAB — PREGNANCY, URINE: Preg Test, Ur: NEGATIVE

## 2017-09-29 LAB — LIPASE, BLOOD: Lipase: 24 U/L (ref 11–51)

## 2017-09-29 MED ORDER — IOPAMIDOL (ISOVUE-300) INJECTION 61%
INTRAVENOUS | Status: AC
  Filled 2017-09-29: qty 100

## 2017-09-29 MED ORDER — CEPHALEXIN 500 MG PO CAPS: 500.0000 mg | ORAL_CAPSULE | Freq: Two times a day (BID) | ORAL | 0 refills | Status: AC

## 2017-09-29 MED ORDER — METHOCARBAMOL 500 MG PO TABS: 500.0000 mg | ORAL_TABLET | Freq: Two times a day (BID) | ORAL | 0 refills | Status: DC

## 2017-09-29 MED ORDER — IBUPROFEN 600 MG PO TABS: 600.0000 mg | ORAL_TABLET | Freq: Four times a day (QID) | ORAL | 0 refills | Status: DC | PRN

## 2017-09-29 MED ORDER — ACETAMINOPHEN 500 MG PO TABS
1000.0000 mg | ORAL_TABLET | Freq: Once | ORAL | Status: AC
  Administered 2017-09-29: 1000 mg via ORAL
  Filled 2017-09-29: qty 2

## 2017-09-29 MED ORDER — IOPAMIDOL (ISOVUE-300) INJECTION 61%
100.0000 mL | Freq: Once | INTRAVENOUS | Status: AC | PRN
  Administered 2017-09-29: 100 mL via INTRAVENOUS
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
## 2017-09-29 NOTE — Discharge Instructions (Addendum)
Your work-up today is very reassuring, imaging overall looks good, most of your pain is likely coming from soft tissue and muscle injury.  Your urinalysis does show some signs of a mild urinary tract infection, please take Keflex twice daily as directed.  You may use ibuprofen and Robaxin to help with pain, Robaxin can cause some drowsiness, please do not take before driving and do not combine with alcohol.  Please follow-up with your regular doctor in the next week to make sure your symptoms are continuing to improve.  Return to the ED for any worsening, new or concerning symptoms.
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
## 2018-07-06 ENCOUNTER — Emergency Department (HOSPITAL_COMMUNITY): Payer: BLUE CROSS/BLUE SHIELD

## 2018-07-06 ENCOUNTER — Encounter (HOSPITAL_COMMUNITY): Payer: Self-pay | Admitting: Emergency Medicine

## 2018-07-06 ENCOUNTER — Other Ambulatory Visit: Payer: Self-pay

## 2018-07-06 ENCOUNTER — Emergency Department (HOSPITAL_COMMUNITY)
Admission: EM | Admit: 2018-07-06 | Discharge: 2018-07-06 | Disposition: A | Discharge status: Home / Self Care | Payer: BLUE CROSS/BLUE SHIELD | Attending: Emergency Medicine | Admitting: Emergency Medicine

## 2018-07-06 DIAGNOSIS — Z79899 Other long term (current) drug therapy: Secondary | ICD-10-CM | POA: Diagnosis not present

## 2018-07-06 DIAGNOSIS — J209 Acute bronchitis, unspecified: Secondary | ICD-10-CM | POA: Diagnosis not present

## 2018-07-06 DIAGNOSIS — F419 Anxiety disorder, unspecified: Secondary | ICD-10-CM | POA: Insufficient documentation

## 2018-07-06 DIAGNOSIS — J45909 Unspecified asthma, uncomplicated: Secondary | ICD-10-CM | POA: Diagnosis not present

## 2018-07-06 DIAGNOSIS — Z91040 Latex allergy status: Secondary | ICD-10-CM | POA: Diagnosis not present

## 2018-07-06 DIAGNOSIS — Z91010 Allergy to peanuts: Secondary | ICD-10-CM | POA: Diagnosis not present

## 2018-07-06 DIAGNOSIS — R05 Cough: Secondary | ICD-10-CM | POA: Diagnosis present

## 2018-07-06 MED ORDER — AZITHROMYCIN 250 MG PO TABS: 250.0000 mg | ORAL_TABLET | Freq: Every day | ORAL | 0 refills | Status: DC

## 2018-07-06 MED ORDER — ACETAMINOPHEN 500 MG PO TABS
1000.0000 mg | ORAL_TABLET | Freq: Once | ORAL | Status: AC
Start: 2018-07-06 — End: 2018-07-06
  Administered 2018-07-06: 1000 mg via ORAL
  Filled 2018-07-06: qty 2

## 2018-07-06 MED ORDER — METHOCARBAMOL 500 MG PO TABS: 500.0000 mg | ORAL_TABLET | Freq: Three times a day (TID) | ORAL | 0 refills | Status: DC | PRN

## 2018-07-06 NOTE — Discharge Instructions (Signed)
You may take Tylenol and ibuprofen as needed for fever and pain.  Follow-up with your primary physician if your symptoms have not improved.  Avoid public spaces until your are fever has resolved.

## 2018-07-06 NOTE — ED Provider Notes (Signed)
[facility] EMERGENCY DEPARTMENT Provider Note   CSN: [id number] Arrival date & time: 07/06/18  1046    History   Chief Complaint Chief Complaint  Patient presents with  . Fever  . Cough  . Flu-like symptoms    HPI Rebecca Eaton is a 23 y.o. female.     HPI Patient presents with 1 week of productive cough, nasal congestion, sore throat, and fever.  No known sick contacts.  Specifically no exposures Covid-19 or recent travel.  Patient developed central chest wall pain especially with coughing and deep breathing over the last 3 days.  She became anxious about this this morning and prompted her to call EMS for transport to the emergency department. Past Medical History:  Diagnosis Date  . Asthma     Patient Active Problem List   Diagnosis Date Noted  . SVD (spontaneous vaginal delivery) 02/18/2017  . Abnormal first trimester screen 09/02/2016  . Cervical dysplasia 08/13/2016    Past Surgical History:  Procedure Laterality Date  . NO PAST SURGERIES       OB History    Gravida  2   Para  2   Term  2   Preterm      AB      Living  2     SAB      TAB      Ectopic      Multiple      Live Births  2        Obstetric Comments  G1: 7lbs 8oz. No issues.          Home Medications    Prior to Admission medications   Medication Sig Start Date End Date Taking? Authorizing Provider  sertraline (ZOLOFT) 50 MG tablet Take 1 tablet (50 mg total) by mouth daily. 07/02/17  Yes Palma Holter, MD  azithromycin (ZITHROMAX) 250 MG tablet Take 1 tablet (250 mg total) by mouth daily. Take first 2 tablets together, then 1 every day until finished. 07/06/18   Loren Racer, MD  ibuprofen (ADVIL,MOTRIN) 600 MG tablet Take 1 tablet (600 mg total) by mouth every 6 (six) hours as needed. Patient not taking: Reported on 07/06/2018 09/29/17   Dartha Lodge, PA-C  methocarbamol (ROBAXIN) 500 MG tablet Take 1 tablet (500 mg total) by mouth every 8  (eight) hours as needed for muscle spasms. Patient not taking: Reported on 07/06/2018 07/06/18   Loren Racer, MD    Family History Family History  Problem Relation Age of Onset  . Heart disease Mother   . Heart disease Father   . Diabetes Paternal Grandmother     Social History Social History   Tobacco Use  . Smoking status: Never Smoker  . Smokeless tobacco: Never Used  Substance Use Topics  . Alcohol use: No  . Drug use: No     Allergies   Peanut-containing drug products and Latex   Review of Systems Review of Systems  Constitutional: Positive for appetite change, chills, fatigue and fever.  HENT: Positive for congestion, rhinorrhea and sore throat. Negative for trouble swallowing.   Respiratory: Positive for cough. Negative for shortness of breath and wheezing.   Cardiovascular: Positive for chest pain. Negative for palpitations and leg swelling.  Gastrointestinal: Negative for abdominal pain, constipation, diarrhea, nausea and vomiting.  Genitourinary: Negative for dysuria, flank pain and frequency.  Musculoskeletal: Negative for back pain, myalgias, neck pain and neck stiffness.  Skin: Negative for rash and wound.  Neurological: Negative for  dizziness, weakness, light-headedness, numbness and headaches.  Psychiatric/Behavioral: The patient is nervous/anxious.   All other systems reviewed and are negative.    Physical Exam Updated Vital Signs BP 114/80   Pulse 61   Temp 98.2 F (36.8 C) (Oral)   Resp 18   Wt 73 kg   LMP 06/18/2018   SpO2 96%   BMI 28.52 kg/m   Physical Exam Vitals signs and nursing note reviewed.  Constitutional:      General: She is not in acute distress.    Appearance: Normal appearance. She is well-developed. She is not ill-appearing.  HENT:     Head: Normocephalic and atraumatic.     Nose: Rhinorrhea present.     Mouth/Throat:     Mouth: Mucous membranes are moist.     Pharynx: Posterior oropharyngeal erythema present. No  oropharyngeal exudate.  Eyes:     Extraocular Movements: Extraocular movements intact.     Conjunctiva/sclera: Conjunctivae normal.     Pupils: Pupils are equal, round, and reactive to light.  Neck:     Musculoskeletal: Normal range of motion and neck supple. No neck rigidity or muscular tenderness.     Comments: No meningismus Cardiovascular:     Rate and Rhythm: Normal rate and regular rhythm.     Heart sounds: No murmur. No friction rub. No gallop.   Pulmonary:     Effort: Pulmonary effort is normal. No respiratory distress.     Breath sounds: Normal breath sounds. No stridor. No wheezing, rhonchi or rales.     Comments: Central chest wall tenderness with palpation.  No crepitance or deformity. Chest:     Chest wall: Tenderness present.  Abdominal:     General: Bowel sounds are normal.     Palpations: Abdomen is soft.     Tenderness: There is no abdominal tenderness. There is no guarding or rebound.  Musculoskeletal: Normal range of motion.        General: No swelling, tenderness, deformity or signs of injury.     Right lower leg: No edema.     Left lower leg: No edema.     Comments: No midline thoracic or lumbar tenderness.  No CVA tenderness.  No lower extremity swelling, asymmetry or tenderness.  Lymphadenopathy:     Cervical: No cervical adenopathy.  Skin:    General: Skin is warm and dry.     Capillary Refill: Capillary refill takes less than 2 seconds.     Findings: No erythema or rash.  Neurological:     General: No focal deficit present.     Mental Status: She is alert and oriented to person, place, and time.  Psychiatric:        Behavior: Behavior normal.     Comments: Anxious appearing      ED Treatments / Results  Labs (all labs ordered are listed, but only abnormal results are displayed) Labs Reviewed - No data to display  EKG None  Radiology Dg Chest 2 View  Result Date: 07/06/2018 CLINICAL DATA:  Cough and fever over the last month. EXAM: CHEST -  2 VIEW COMPARISON:  09/29/2017 FINDINGS: Art size is normal. Mediastinal shadows are normal. Question bronchial thickening. The lungs are otherwise clear. No infiltrate, collapse or effusion. Bony structures are normal. IMPRESSION: Possible bronchitis.  No consolidation or collapse. Electronically Signed   By: Paulina Fusi M.D.   On: 07/06/2018 11:43    Procedures Procedures (including critical care time)  Medications Ordered in ED Medications  acetaminophen (TYLENOL)  tablet 1,000 mg (1,000 mg Oral Given 07/06/18 1126)     Initial Impression / Assessment and Plan / ED Course  I have reviewed the triage vital signs and the nursing notes.  Pertinent labs & imaging results that were available during my care of the patient were reviewed by me and considered in my medical decision making (see chart for details).        Patient is very well-appearing.  Will treat for acute bronchitis.  Return precautions given.  Final Clinical Impressions(s) / ED Diagnoses   Final diagnoses:  Acute bronchitis, unspecified organism    ED Discharge Orders         Ordered    methocarbamol (ROBAXIN) 500 MG tablet  Every 8 hours PRN     07/06/18 1232    azithromycin (ZITHROMAX) 250 MG tablet  Daily     07/06/18 1232           Loren Racer, MD 07/06/18 1631

## 2018-07-06 NOTE — ED Triage Notes (Addendum)
Pt in from home via [facility] with flu-like symptoms x 1 wk. States symptoms began 1 wk ago, first as fever. Developed into congestion, gen aches and prod cough. C/o chest wall pain x 3 days. Did not get flu vaccine this year, denies any recent travel or sob

## 2018-10-15 ENCOUNTER — Other Ambulatory Visit: Payer: Self-pay

## 2018-10-15 ENCOUNTER — Inpatient Hospital Stay (HOSPITAL_COMMUNITY)
Admission: AD | Admit: 2018-10-15 | Discharge: 2018-10-15 | Disposition: A | Discharge status: Home / Self Care | Payer: BLUE CROSS/BLUE SHIELD | Attending: Obstetrics and Gynecology | Admitting: Obstetrics and Gynecology

## 2018-10-15 ENCOUNTER — Inpatient Hospital Stay (HOSPITAL_COMMUNITY): Payer: BLUE CROSS/BLUE SHIELD

## 2018-10-15 ENCOUNTER — Encounter (HOSPITAL_COMMUNITY): Payer: Self-pay | Admitting: *Deleted

## 2018-10-15 DIAGNOSIS — Z3A01 Less than 8 weeks gestation of pregnancy: Secondary | ICD-10-CM | POA: Insufficient documentation

## 2018-10-15 DIAGNOSIS — O209 Hemorrhage in early pregnancy, unspecified: Secondary | ICD-10-CM | POA: Diagnosis not present

## 2018-10-15 DIAGNOSIS — O3680X0 Pregnancy with inconclusive fetal viability, not applicable or unspecified: Secondary | ICD-10-CM

## 2018-10-15 DIAGNOSIS — N939 Abnormal uterine and vaginal bleeding, unspecified: Secondary | ICD-10-CM

## 2018-10-15 HISTORY — DX: Depression, unspecified: F32.A

## 2018-10-15 HISTORY — DX: Anxiety disorder, unspecified: F41.9

## 2018-10-15 LAB — CBC WITH DIFFERENTIAL/PLATELET
Abs Immature Granulocytes: 0.04 10*3/uL (ref 0.00–0.07)
Basophils Absolute: 0 10*3/uL (ref 0.0–0.1)
Basophils Relative: 1 %
Eosinophils Absolute: 0.1 10*3/uL (ref 0.0–0.5)
Eosinophils Relative: 1 %
HCT: 37.6 % (ref 36.0–46.0)
Hemoglobin: 11.8 g/dL — ABNORMAL LOW (ref 12.0–15.0)
Immature Granulocytes: 1 %
Lymphocytes Relative: 24 %
Lymphs Abs: 1.9 10*3/uL (ref 0.7–4.0)
MCH: 27.5 pg (ref 26.0–34.0)
MCHC: 31.4 g/dL (ref 30.0–36.0)
MCV: 87.6 fL (ref 80.0–100.0)
Monocytes Absolute: 0.9 10*3/uL (ref 0.1–1.0)
Monocytes Relative: 12 %
Neutro Abs: 4.8 10*3/uL (ref 1.7–7.7)
Neutrophils Relative %: 61 %
Platelets: 331 10*3/uL (ref 150–400)
RBC: 4.29 MIL/uL (ref 3.87–5.11)
RDW: 13.4 % (ref 11.5–15.5)
WBC: 7.8 10*3/uL (ref 4.0–10.5)
nRBC: 0 % (ref 0.0–0.2)

## 2018-10-15 LAB — URINALYSIS, MICROSCOPIC (REFLEX): RBC / HPF: >50 RBC/hpf (ref 0–5)

## 2018-10-15 LAB — WET PREP, GENITAL
Clue Cells Wet Prep HPF POC: NONE SEEN
Sperm: NONE SEEN
Trich, Wet Prep: NONE SEEN
Yeast Wet Prep HPF POC: NONE SEEN

## 2018-10-15 LAB — URINALYSIS, ROUTINE W REFLEX MICROSCOPIC

## 2018-10-15 LAB — HCG, QUANTITATIVE, PREGNANCY: hCG, Beta Chain, Quant, S: 5988 m[IU]/mL — ABNORMAL HIGH (ref <5)

## 2018-10-15 LAB — POCT PREGNANCY, URINE: Preg Test, Ur: POSITIVE — AB

## 2018-10-15 NOTE — Discharge Instructions (Signed)
Threatened Miscarriage    A threatened miscarriage is when you have bleeding from your vagina during the first 20 weeks of pregnancy but the pregnancy does not end. Your doctor will do tests to make sure you are still pregnant. The cause of the bleeding may not be known. This condition does not mean your pregnancy will end, but it does increase the risk that it will end (complete miscarriage).  Follow these instructions at home:  · Get plenty of rest.  · If you have bleeding in your vagina, do not have sex or use tampons.  · Do not douche.  · Do not smoke or use drugs.  · Do not drink alcohol.  · Avoid caffeine.  · Keep all follow-up prenatal visits as told by your doctor. This is important.  Contact a doctor if:  · You have light bleeding from your vagina.  · You have belly pain or cramping.  · You have a fever.  Get help right away if:  · You have heavy bleeding from your vagina.  · You have clots of blood coming from your vagina.  · You pass tissue from your vagina.  · You have a gush of fluid from your vagina.  · You are leaking fluid from your vagina.  · You have very bad pain or cramps in your low back or belly.  · You have fever, chills, and very bad belly pain.  Summary  · A threatened miscarriage is when you have bleeding from your vagina during the first 20 weeks of pregnancy but the pregnancy does not end.  · This condition does not mean your pregnancy will end, but it does increase the risk that it will end (complete miscarriage).  · Get plenty of rest. If you have bleeding in your vagina, do not have sex or use tampons.  · Keep all follow-up prenatal visits as told by your doctor. This is important.  This information is not intended to replace advice given to you by your health care provider. Make sure you discuss any questions you have with your health care provider.  Document Released: 03/21/2008 Document Revised: 07/05/2016 Document Reviewed: 07/05/2016  Elsevier Interactive Patient Education © 2019  [employer]

## 2018-10-15 NOTE — MAU Provider Note (Signed)
History     CSN: [id number]  Arrival date and time: 10/15/18 1730   First Provider Initiated Contact with Patient 10/15/18 1821      Chief Complaint  Patient presents with  . Vaginal Bleeding   HPI Rebecca Eaton is a 23 y.o. G3P2002 at 3468w4d who presents to MAU with chief complaint of vaginal bleeding. This is a new problem, onset this morning. Patient states her bleeding has waxed and waned throughout the day. She has also noticed color changes ranging from dark red bleeding to pink-tinged mucus. She denies abdominal pain, weakness, syncope, dysuria, abdominal tenderness, fever or recent illness.  Patient reports intercourse Sunday morning 10/11/18. She denies pain, spotting, bleeding during or following intercourse.  Nonsmoker, no SI, HI, IPV  OB History    Gravida  3   Para  2   Term  2   Preterm      AB      Living  2     SAB      TAB      Ectopic      Multiple      Live Births  2        Obstetric Comments  G1: 7lbs 8oz. No issues.         Past Medical History:  Diagnosis Date  . Anxiety   . Asthma   . Depression     Past Surgical History:  Procedure Laterality Date  . NO PAST SURGERIES      Family History  Problem Relation Age of Onset  . Heart disease Mother   . Heart disease Father   . Diabetes Paternal Grandmother     Social History   Tobacco Use  . Smoking status: Never Smoker  . Smokeless tobacco: Never Used  Substance Use Topics  . Alcohol use: Yes    Comment: rarely  . Drug use: No    Allergies:  Allergies  Allergen Reactions  . Peanut-Containing Drug Products Anaphylaxis  . Latex Rash    Medications Prior to Admission  Medication Sig Dispense Refill Last Dose  . azithromycin (ZITHROMAX) 250 MG tablet Take 1 tablet (250 mg total) by mouth daily. Take first 2 tablets together, then 1 every day until finished. 6 tablet 0   . ibuprofen (ADVIL,MOTRIN) 600 MG tablet Take 1 tablet (600 mg total) by mouth every 6 (six)  hours as needed. (Patient not taking: Reported on 07/06/2018) 30 tablet 0   . methocarbamol (ROBAXIN) 500 MG tablet Take 1 tablet (500 mg total) by mouth every 8 (eight) hours as needed for muscle spasms. (Patient not taking: Reported on 07/06/2018) 30 tablet 0   . sertraline (ZOLOFT) 50 MG tablet Take 1 tablet (50 mg total) by mouth daily. 30 tablet 0     Review of Systems  Constitutional: Negative for chills, fatigue and fever.  Respiratory: Negative for shortness of breath.   Gastrointestinal: Negative for abdominal pain, nausea and vomiting.  Genitourinary: Positive for vaginal bleeding. Negative for difficulty urinating, dysuria and flank pain.  Musculoskeletal: Negative for back pain.  Neurological: Negative for dizziness, syncope, weakness and headaches.  All other systems reviewed and are negative.  Physical Exam   Blood pressure 110/68, pulse 88, temperature 98.4 F (36.9 C), resp. rate 18, height 5\' 4"  (1.626 m), weight 78 kg, last menstrual period 09/06/2018, unknown if currently breastfeeding.  Physical Exam  Nursing note and vitals reviewed. Constitutional: She is oriented to person, place, and time. She appears well-developed and well-nourished.  Cardiovascular: Normal rate and normal pulses.  Respiratory: Effort normal. No respiratory distress. She has no wheezes. She has no rales.  GI: Soft. She exhibits no distension. There is no abdominal tenderness. There is no rigidity, no rebound, no guarding and no CVA tenderness.  Genitourinary:    Vaginal discharge present.     Genitourinary Comments: Moderate dark red blood visible proximal to cervix. Removed with fox swab x 1, scant new bleeding following use of fox swab   Musculoskeletal: Normal range of motion.  Neurological: She is alert and oriented to person, place, and time.  Skin: Skin is warm and dry.  Psychiatric: She has a normal mood and affect. Her behavior is normal. Judgment and thought content normal.   MAU  Course/MDM  Procedures: sterile speculum exam  Patient Vitals for the past 24 hrs:  BP Temp Temp src Pulse Resp Height Weight  10/15/18 2025 111/79 98.6 F (37 C) Oral 77 16 - -  10/15/18 1752 110/68 98.4 F (36.9 C) - 88 18 5\' 4"  (1.626 m) 78 kg    Results for orders placed or performed during the hospital encounter of 10/15/18 (from the past 24 hour(s))  Urinalysis, Routine w reflex microscopic     Status: Abnormal   Collection Time: 10/15/18  6:04 PM  Result Value Ref Range   Color, Urine RED (A) YELLOW   APPearance CLOUDY (A) CLEAR   Specific Gravity, Urine  1.005 - 1.030    TEST NOT REPORTED DUE TO COLOR INTERFERENCE OF URINE PIGMENT   pH  5.0 - 8.0    TEST NOT REPORTED DUE TO COLOR INTERFERENCE OF URINE PIGMENT   Glucose, UA (A) NEGATIVE mg/dL    TEST NOT REPORTED DUE TO COLOR INTERFERENCE OF URINE PIGMENT   Hgb urine dipstick (A) NEGATIVE    TEST NOT REPORTED DUE TO COLOR INTERFERENCE OF URINE PIGMENT   Bilirubin Urine (A) NEGATIVE    TEST NOT REPORTED DUE TO COLOR INTERFERENCE OF URINE PIGMENT   Ketones, ur (A) NEGATIVE mg/dL    TEST NOT REPORTED DUE TO COLOR INTERFERENCE OF URINE PIGMENT   Protein, ur (A) NEGATIVE mg/dL    TEST NOT REPORTED DUE TO COLOR INTERFERENCE OF URINE PIGMENT   Nitrite (A) NEGATIVE    TEST NOT REPORTED DUE TO COLOR INTERFERENCE OF URINE PIGMENT   Leukocytes,Ua (A) NEGATIVE    TEST NOT REPORTED DUE TO COLOR INTERFERENCE OF URINE PIGMENT  Urinalysis, Microscopic (reflex)     Status: Abnormal   Collection Time: 10/15/18  6:04 PM  Result Value Ref Range   RBC / HPF >50 0 - 5 RBC/hpf   WBC, UA 0-5 0 - 5 WBC/hpf   Bacteria, UA RARE (A) NONE SEEN   Squamous Epithelial / LPF 0-5 0 - 5   Mucus PRESENT   Pregnancy, urine POC     Status: Abnormal   Collection Time: 10/15/18  6:05 PM  Result Value Ref Range   Preg Test, Ur POSITIVE (A) NEGATIVE  Wet prep, genital     Status: Abnormal   Collection Time: 10/15/18  6:31 PM   Specimen: Cervix  Result  Value Ref Range   Yeast Wet Prep HPF POC NONE SEEN NONE SEEN   Trich, Wet Prep NONE SEEN NONE SEEN   Clue Cells Wet Prep HPF POC NONE SEEN NONE SEEN   WBC, Wet Prep HPF POC FEW (A) NONE SEEN   Sperm NONE SEEN   CBC with Differential/Platelet     Status: Abnormal  Collection Time: 10/15/18  6:51 PM  Result Value Ref Range   WBC 7.8 4.0 - 10.5 K/uL   RBC 4.29 3.87 - 5.11 MIL/uL   Hemoglobin 11.8 (L) 12.0 - 15.0 g/dL   HCT 11.937.6 14.736.0 - 82.946.0 %   MCV 87.6 80.0 - 100.0 fL   MCH 27.5 26.0 - 34.0 pg   MCHC 31.4 30.0 - 36.0 g/dL   RDW 56.213.4 13.011.5 - 86.515.5 %   Platelets 331 150 - 400 K/uL   nRBC 0.0 0.0 - 0.2 %   Neutrophils Relative % 61 %   Neutro Abs 4.8 1.7 - 7.7 K/uL   Lymphocytes Relative 24 %   Lymphs Abs 1.9 0.7 - 4.0 K/uL   Monocytes Relative 12 %   Monocytes Absolute 0.9 0.1 - 1.0 K/uL   Eosinophils Relative 1 %   Eosinophils Absolute 0.1 0.0 - 0.5 K/uL   Basophils Relative 1 %   Basophils Absolute 0.0 0.0 - 0.1 K/uL   Immature Granulocytes 1 %   Abs Immature Granulocytes 0.04 0.00 - 0.07 K/uL  hCG, quantitative, pregnancy     Status: Abnormal   Collection Time: 10/15/18  6:51 PM  Result Value Ref Range   hCG, Beta Chain, Quant, S 5,988 (H) <5 mIU/mL   [place] Ob Less Than 14 Weeks With Ob Transvaginal  Result Date: 10/15/2018 CLINICAL DATA:  Initial evaluation for acute vaginal bleeding, early pregnancy. EXAM: OBSTETRIC <14 WK US AND TRANSVAGINAL OB US TECHNIQUE: Both transabdominal and transvaginal ultrasound examinations were performed for complete evaluation of the gestation as well as the maternal uterus, adnexal regions, and pelvic cul-de-sac. Transvaginal technique was performed to assess early pregnancy. COMPARISON:  None. FINDINGS: Intrauterine gestational sac: Negative. Yolk sac:  Negative. Embryo:  Negative. Cardiac Activity: Negative. Heart Rate: N/A  bpm Subchorionic hemorrhage:  None visualized. Maternal uterus/adnexae: Ovaries are normal in appearance bilaterally. No  adnexal mass. Small volume free fluid within the pelvis, presumably physiologic. IMPRESSION: 1. Early pregnancy with no discrete IUP or adnexal mass identified. Finding is consistent with a pregnancy of unknown anatomic location. Differential considerations include IUP to early to visualize, recent SAB, or possibly occult ectopic pregnancy. Close clinical monitoring with serial beta HCGs and close interval follow-up ultrasound recommended as clinically warranted. 2. No other acute maternal uterine or adnexal abnormality identified. Electronically Signed   By: Rise MuBenjamin  McClintock M.D.   On: 10/15/2018 19:33   Assessment and Plan  --23 y.o. G3P2002 at 1377w4d by LMP --Pregnancy of unknown location --Bleeding/ectopic precautions reviewed with patient --Discharge home in stable condition  F/U: Patient to return to MAU Sunday morning for repeat Quant hCG  Calvert CantorSamantha C Derek Laughter, CNM 10/15/2018, 8:58 PM

## 2018-10-15 NOTE — MAU Note (Signed)
Pt reports she had a positive HPT on Sunday. Had intercourse last night and started having dark brown bleeding last night that has turned more red today. Denies any pain or cramping at this time.

## 2018-10-16 LAB — GC/CHLAMYDIA PROBE AMP (~~LOC~~) NOT AT ARMC
Chlamydia: NEGATIVE
Neisseria Gonorrhea: NEGATIVE

## 2018-10-18 ENCOUNTER — Other Ambulatory Visit: Payer: Self-pay

## 2018-10-18 ENCOUNTER — Inpatient Hospital Stay (HOSPITAL_COMMUNITY)
Admission: AD | Admit: 2018-10-18 | Discharge: 2018-10-18 | Disposition: A | Discharge status: Home / Self Care | Payer: BLUE CROSS/BLUE SHIELD | Attending: Obstetrics and Gynecology | Admitting: Obstetrics and Gynecology

## 2018-10-18 ENCOUNTER — Encounter (HOSPITAL_COMMUNITY): Payer: Self-pay | Admitting: *Deleted

## 2018-10-18 DIAGNOSIS — O009 Unspecified ectopic pregnancy without intrauterine pregnancy: Secondary | ICD-10-CM | POA: Diagnosis not present

## 2018-10-18 DIAGNOSIS — Z3A01 Less than 8 weeks gestation of pregnancy: Secondary | ICD-10-CM | POA: Diagnosis not present

## 2018-10-18 DIAGNOSIS — O0281 Inappropriate change in quantitative human chorionic gonadotropin (hCG) in early pregnancy: Secondary | ICD-10-CM | POA: Diagnosis not present

## 2018-10-18 LAB — COMPREHENSIVE METABOLIC PANEL
ALT: 9 U/L (ref 0–44)
AST: 14 U/L — ABNORMAL LOW (ref 15–41)
Albumin: 3.4 g/dL — ABNORMAL LOW (ref 3.5–5.0)
Alkaline Phosphatase: 60 U/L (ref 38–126)
Anion gap: 8 (ref 5–15)
BUN: 10 mg/dL (ref 6–20)
CO2: 22 mmol/L (ref 22–32)
Calcium: 9 mg/dL (ref 8.9–10.3)
Chloride: 108 mmol/L (ref 98–111)
Creatinine, Ser: 0.82 mg/dL (ref 0.44–1.00)
GFR calc Af Amer: >60 mL/min (ref >60)
GFR calc non Af Amer: >60 mL/min (ref >60)
Glucose, Bld: 98 mg/dL (ref 70–99)
Potassium: 3.8 mmol/L (ref 3.5–5.1)
Sodium: 138 mmol/L (ref 135–145)
Total Bilirubin: 0.3 mg/dL (ref 0.3–1.2)
Total Protein: 6.8 g/dL (ref 6.5–8.1)

## 2018-10-18 LAB — HCG, QUANTITATIVE, PREGNANCY: hCG, Beta Chain, Quant, S: 5998 m[IU]/mL — ABNORMAL HIGH (ref <5)

## 2018-10-18 MED ORDER — PROMETHAZINE HCL 25 MG PO TABS: 25.0000 mg | ORAL_TABLET | Freq: Four times a day (QID) | ORAL | 0 refills | Status: AC | PRN

## 2018-10-18 MED ORDER — METHOTREXATE FOR ECTOPIC PREGNANCY
50.0000 mg/m2 | Freq: Once | INTRAMUSCULAR | Status: AC
  Administered 2018-10-18: 95 mg via INTRAMUSCULAR
  Filled 2018-10-18: qty 1

## 2018-10-18 NOTE — MAU Provider Note (Signed)
Chief Complaint: Follow-up   First Provider Initiated Contact with Patient 10/18/18 1931     SUBJECTIVE HPI: Rebecca Eaton is a 23 y.o. G3P2002 at 42w0dwho presents to [facility] for a f/u HCG. Was seen in MAU on Thursday evening for vaginal bleeding. No IUP or adnexal mass seen on ultrasound with a HCG of 5988. Patient reports light spotting since that visit. Had an episode of abdominal pain Friday evening but no pain since.   Past Medical History:  Diagnosis Date  . Anxiety   . Asthma   . Depression    OB History  Gravida Para Term Preterm AB Living  '3 2 2     2  ' SAB TAB Ectopic Multiple Live Births          2    # Outcome Date GA Lbr Len/2nd Weight Sex Delivery Anes PTL Lv  3 Current           2 Term 02/18/17 [id number] / 00:08 3456 g F Vag-Spont Local  LIV  1 Term 07/21/12     Vag-Spont       Obstetric Comments  G1: 7lbs 8oz. No issues.    Past Surgical History:  Procedure Laterality Date  . NO PAST SURGERIES     Social History   Socioeconomic History  . Marital status: Single    Spouse name: Not on file  . Number of children: Not on file  . Years of education: Not on file  . Highest education level: Not on file  Occupational History  . Not on file  Social Needs  . Financial resource strain: Not on file  . Food insecurity    Worry: Not on file    Inability: Not on file  . Transportation needs    Medical: Not on file    Non-medical: Not on file  Tobacco Use  . Smoking status: Never Smoker  . Smokeless tobacco: Never Used  Substance and Sexual Activity  . Alcohol use: Yes    Comment: rarely  . Drug use: No  . Sexual activity: Yes    Birth control/protection: None  Lifestyle  . Physical activity    Days per week: Not on file    Minutes per session: Not on file  . Stress: Not on file  Relationships  . Social coHerbalistn phone: Not on file    Gets together: Not on file    Attends religious service: Not on file    Active  member of club or organization: Not on file    Attends meetings of clubs or organizations: Not on file    Relationship status: Not on file  . Intimate partner violence    Fear of current or ex partner: Not on file    Emotionally abused: Not on file    Physically abused: Not on file    Forced sexual activity: Not on file  Other Topics Concern  . Not on file  Social History Narrative  . Not on file   Family History  Problem Relation Age of Onset  . Heart disease Mother   . Heart disease Father   . Diabetes Paternal Grandmother    No current facility-administered medications on file prior to encounter.    No current outpatient medications on file prior to encounter.   Allergies  Allergen Reactions  . Peanut-Containing Drug Products Anaphylaxis  . Latex Rash    I have reviewed patient's Past Medical Hx, Surgical Hx, Family Hx,  Social Hx, medications and allergies.   Review of Systems  Constitutional: Negative.   Genitourinary: Positive for vaginal bleeding.    OBJECTIVE Patient Vitals for the past 24 hrs:  BP Temp Temp src Pulse Resp SpO2  10/18/18 1724 123/72 98.2 F (36.8 C) Oral 93 18 100 %   Constitutional: Well-developed, well-nourished female in no acute distress.  Cardiovascular: normal rate & rhythm, no murmur Respiratory: normal rate and effort. Lung sounds clear throughout GI: Abd soft, non-tender, Pos BS x 4. No guarding or rebound tenderness MS: Extremities nontender, no edema, normal ROM Neurologic: Alert and oriented x 4.     LAB RESULTS Results for orders placed or performed during the hospital encounter of 10/18/18 (from the past 24 hour(s))  hCG, quantitative, pregnancy     Status: Abnormal   Collection Time: 10/18/18  5:45 PM  Result Value Ref Range   hCG, Beta Chain, Quant, S 5,998 (H) <5 mIU/mL  Comprehensive metabolic panel     Status: Abnormal   Collection Time: 10/18/18  5:45 PM  Result Value Ref Range   Sodium 138 135 - 145 mmol/L    Potassium 3.8 3.5 - 5.1 mmol/L   Chloride 108 98 - 111 mmol/L   CO2 22 22 - 32 mmol/L   Glucose, Bld 98 70 - 99 mg/dL   BUN 10 6 - 20 mg/dL   Creatinine, Ser 0.82 0.44 - 1.00 mg/dL   Calcium 9.0 8.9 - 10.3 mg/dL   Total Protein 6.8 6.5 - 8.1 g/dL   Albumin 3.4 (L) 3.5 - 5.0 g/dL   AST 14 (L) 15 - 41 U/L   ALT 9 0 - 44 U/L   Alkaline Phosphatase 60 38 - 126 U/L   Total Bilirubin 0.3 0.3 - 1.2 mg/dL   GFR calc non Af Amer >60 >60 mL/min   GFR calc Af Amer >60 >60 mL/min   Anion gap 8 5 - 15    IMAGING No results found.  MAU COURSE Orders Placed This Encounter  Procedures  . hCG, quantitative, pregnancy  . Comprehensive metabolic panel   Meds ordered this encounter  Medications  . methotrexate [facility]) chemo injection kit 95 mg    MDM Pt denies pain. Bleeding has decreased since previous visit.  Vital signs stable.   HCG with minimal change 2956<2130 Reviewed labs & previous imaging with Dr. Rosana Hoes. Will offer MTX  The risks of methotrexate were reviewed including failure requiring repeat dosing or eventual surgery. She understands that methotrexate involves frequent return visits to monitor lab values and that she remains at risk of ectopic rupture until her beta is less than assay. ?The patient opts to proceed with methotrexate.  She has no history of hepatic or renal dysfunction, has normal BUN/Cr/LFT's/platelets.  She is felt to be reliable for follow-up. Side effects of photosensitivity & GI upset were discussed.  She knows to avoid direct sunlight and abstain from alcohol, aspirin and aspirin-like products for two weeks. She was counseled to discontinue any MVI with folic acid. ?She understands to follow up on D4 (Wednesday) and D7 (Saturday) for repeat BHCG and was given the instruction sheet. Strict ectopic precautions were reviewed, the patient knows to call with any abdominal pain, vomiting, fainting, or any concerns with her health.  Rh+, no Rhogam  necessary   A: 1. Inappropriate change in quantitative hCG in early pregnancy    P: Discharge home Strict ectopic return precautions Pt scheduled for Day 4 HCG check on Wednesday Given educational  material regarding MTX & ectopic pregnancy Tylenol prn pain Rx phenergan  Jorje Guild, NP 10/18/2018  7:52 PM

## 2018-10-18 NOTE — Discharge Instructions (Signed)
Methotrexate Treatment for an Ectopic Pregnancy, Care After °This sheet gives you information about how to care for yourself after your procedure. Your health care provider may also give you more specific instructions. If you have problems or questions, contact your health care provider. °What can I expect after the procedure? °After the procedure, it is common to have: °· Abdominal cramping. °· Vaginal bleeding. °· Fatigue. °· Nausea. °· Vomiting. °· Diarrhea. °Blood tests will be taken at timed intervals for several days or weeks to check your pregnancy hormone levels. The blood tests will be done until the pregnancy hormone can no longer be detected in the blood. °Follow these instructions at home: °Activity °· Do not have sex until your health care provider approves. °· Limit activities that take a lot of effort as told by your health care provider. °Medicines °· Take over the counter and prescription medicines only as told by your health care provider. °· Do not take aspirin, ibuprofen, naproxen, or any other NSAIDs. °· Do not take folic acid, prenatal vitamins, or other vitamins that contain folic acid. °General instructions ° °· Do not drink alcohol. °· Follow instructions from your health care provider on how and when to report any symptoms that may indicate a ruptured ectopic pregnancy. °· Keep all follow-up visits as told by your health care provider. This is important. °Contact a health care provider if: °· You have persistent nausea and vomiting. °· You have persistent diarrhea. °· You are having a reaction to the medicine, such as: °? Tiredness. °? Skin rash. °? Hair loss. °Get help right away if: °· Your abdominal or pelvic pain gets worse. °· You have more vaginal bleeding. °· You feel light-headed or you faint. °· You have shortness of breath. °· Your heart rate increases. °· You develop a cough. °· You have chills. °· You have a fever. °Summary °· After the procedure, it is common to have symptoms  of abdominal cramping, vaginal bleeding and fatigue. You may also experience other symptoms. °· Blood tests will be taken at timed intervals for several days or weeks to check your pregnancy hormone levels. The blood tests will be done until the pregnancy hormone can no longer be detected in the blood. °· Limit strenuous activity as told by your health care provider. °· Follow instructions from your health care provider on how and when to report any symptoms that may indicate a ruptured ectopic pregnancy. °This information is not intended to replace advice given to you by your health care provider. Make sure you discuss any questions you have with your health care provider. °Document Released: 03/28/2011 Document Revised: 03/21/2017 Document Reviewed: 05/28/2016 °Elsevier Patient Education © 2020 [employer] ° ° °Ectopic Pregnancy ° °An ectopic pregnancy is when the fertilized egg attaches (implants) outside the uterus. Most ectopic pregnancies occur in one of the tubes where eggs travel from the ovary to the uterus (fallopian tubes), but the implanting can occur in other locations. In rare cases, ectopic pregnancies occur on the ovary, intestine, pelvis, abdomen, or cervix. In an ectopic pregnancy, the fertilized egg does not have the ability to develop into a normal, healthy baby. °A ruptured ectopic pregnancy is one in which tearing or bursting of a fallopian tube causes internal bleeding. Often, there is intense lower abdominal pain, and vaginal bleeding sometimes occurs. Having an ectopic pregnancy can be life-threatening. If this dangerous condition is not treated, it can lead to blood loss, shock, or even death. °What are the causes? °The   most common cause of this condition is damage to one of the fallopian tubes. A fallopian tube may be narrowed or blocked, and that keeps the fertilized egg from reaching the uterus. °What increases the risk? °This condition is more likely to develop in women of  childbearing age who have different levels of risk. The levels of risk can be divided into three categories. °High risk °· You have gone through infertility treatment. °· You have had an ectopic pregnancy before. °· You have had surgery on the fallopian tubes, or another surgical procedure, such as an abortion. °· You have had surgery to have the fallopian tubes tied (tubal ligation). °· You have problems or diseases of the fallopian tubes. °· You have been exposed to diethylstilbestrol (DES). This medicine was used until 1971, and it had effects on babies whose mothers took the medicine. °· You become pregnant while using an IUD (intrauterine device) for birth control. °Moderate risk °· You have a history of infertility. °· You have had an STI (sexually transmitted infection). °· You have a history of pelvic inflammatory disease (PID). °· You have scarring from endometriosis. °· You have multiple sexual partners. °· You smoke. °Low risk °· You have had pelvic surgery. °· You use vaginal douches. °· You became sexually active before age 18. °What are the signs or symptoms? °Common symptoms of this condition include normal pregnancy symptoms, such as missing a period, nausea, tiredness, abdominal pain, breast tenderness, and bleeding. However, ectopic pregnancy will have additional symptoms, such as: °· Pain with intercourse. °· Irregular vaginal bleeding or spotting. °· Cramping or pain on one side or in the lower abdomen. °· Fast heartbeat, low blood pressure, and sweating. °· Passing out while having a bowel movement. °Symptoms of a ruptured ectopic pregnancy and internal bleeding may include: °· Sudden, severe pain in the abdomen and pelvis. °· Dizziness, weakness, light-headedness, or fainting. °· Pain in the shoulder or neck area. °How is this diagnosed? °This condition is diagnosed by: °· A pelvic exam to locate pain or a mass in the abdomen. °· A pregnancy test. This blood test checks for the presence as well  as the specific level of pregnancy hormone in the bloodstream. °· Ultrasound. This is performed if a pregnancy test is positive. In this test, a probe is inserted into the vagina. The probe will detect a fetus, possibly in a location other than the uterus. °· Taking a sample of uterus tissue (dilation and curettage, or D&C). °· Surgery to perform a visual exam of the inside of the abdomen using a thin, lighted tube that has a tiny camera on the end (laparoscope). °· Culdocentesis. This procedure involves inserting a needle at the top of the vagina, behind the uterus. If blood is present in this area, it may indicate that a fallopian tube is torn. °How is this treated? °This condition is treated with medicine or surgery. °Medicine °· An injection of a medicine (methotrexate) may be given to cause the pregnancy tissue to be absorbed. This medicine may save your fallopian tube. It may be given if: °? The diagnosis is made early, with no signs of active bleeding. °? The fallopian tube has not ruptured. °? You are considered to be a good candidate for the medicine. °Usually, pregnancy hormone blood levels are checked after methotrexate treatment. This is to be sure that the medicine is effective. It may take 4-6 weeks for the pregnancy to be absorbed. Most pregnancies will be absorbed by 3 weeks. °Surgery °·   A laparoscope may be used to remove the pregnancy tissue. °· If severe internal bleeding occurs, a larger cut (incision) may be made in the lower abdomen (laparotomy) to remove the fetus and placenta. This is done to stop the bleeding. °· Part or all of the fallopian tube may be removed (salpingectomy) along with the fetus and placenta. The fallopian tube may also be repaired during the surgery. °· In very rare circumstances, removal of the uterus (hysterectomy) may be required. °· After surgery, pregnancy hormone testing may be done to be sure that there is no pregnancy tissue left. °Whether your treatment is  medicine or surgery, you may receive a Rho (D) immune globulin shot to prevent problems with any future pregnancy. This shot may be given if: °· You are Rh-negative and the baby's father is Rh-positive. °· You are Rh-negative and you do not know the Rh type of the baby's father. °Follow these instructions at home: °· Rest and limit your activity after the procedure for as long as told by your health care provider. °· Until your health care provider says that it is safe: °? Do not lift anything that is heavier than 10 lb (4.5 kg), or the limit that your health care provider tells you. °? Avoid physical exercise and any movement that requires effort (is strenuous). °· To help prevent constipation: °? Eat a healthy diet that includes fruits, vegetables, and whole grains. °? Drink 6-8 glasses of water per day. °Get help right away if: °· You develop worsening pain that is not relieved by medicine. °· You have: °? A fever or chills. °? Vaginal bleeding. °? Redness and swelling at the incision site. °? Nausea and vomiting. °· You feel dizzy or weak. °· You feel light-headed or you faint. °This information is not intended to replace advice given to you by your health care provider. Make sure you discuss any questions you have with your health care provider. °Document Released: 05/16/2004 Document Revised: 03/21/2017 Document Reviewed: 11/08/2015 °Elsevier Patient Education © 2020 Elsevier Inc. ° °

## 2018-10-18 NOTE — MAU Note (Signed)
Pt reports to mau for repeat quant.  Pt reports no worsening bleeding or abd pain at this time.  All VS within normal limits

## 2018-10-20 ENCOUNTER — Telehealth: Payer: Self-pay | Admitting: Family Medicine

## 2018-10-20 NOTE — Telephone Encounter (Signed)
Attempted to call patient x2 about her appointment on 7/1 @ 11:00. No answer, and the voicemail was full so could not leave a message.

## 2018-10-21 ENCOUNTER — Ambulatory Visit: Payer: BLUE CROSS/BLUE SHIELD

## 2018-10-29 ENCOUNTER — Other Ambulatory Visit: Payer: Self-pay

## 2018-10-29 ENCOUNTER — Inpatient Hospital Stay (HOSPITAL_COMMUNITY)
Admission: AD | Admit: 2018-10-29 | Discharge: 2018-10-29 | Disposition: A | Discharge status: Home / Self Care | Payer: BLUE CROSS/BLUE SHIELD | Attending: Obstetrics and Gynecology | Admitting: Obstetrics and Gynecology

## 2018-10-29 DIAGNOSIS — O360910 Maternal care for other rhesus isoimmunization, first trimester, not applicable or unspecified: Secondary | ICD-10-CM

## 2018-10-29 DIAGNOSIS — O00109 Unspecified tubal pregnancy without intrauterine pregnancy: Secondary | ICD-10-CM | POA: Insufficient documentation

## 2018-10-29 DIAGNOSIS — Z3A01 Less than 8 weeks gestation of pregnancy: Secondary | ICD-10-CM | POA: Diagnosis not present

## 2018-10-29 DIAGNOSIS — O009 Unspecified ectopic pregnancy without intrauterine pregnancy: Secondary | ICD-10-CM

## 2018-10-29 DIAGNOSIS — Z6790 Unspecified blood type, Rh positive: Secondary | ICD-10-CM

## 2018-10-29 LAB — HCG, QUANTITATIVE, PREGNANCY: hCG, Beta Chain, Quant, S: 1875 m[IU]/mL — ABNORMAL HIGH (ref <5)
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
## 2018-10-29 NOTE — Discharge Instructions (Signed)

## 2018-10-29 NOTE — MAU Note (Signed)
Pt reports to MAU for repeat HCG levels.  Pt denies any pain at this time and reports her bleeding has stopped. Pt has no other complaints at this time

## 2019-02-06 ENCOUNTER — Encounter (HOSPITAL_COMMUNITY): Payer: Self-pay | Admitting: Emergency Medicine

## 2019-02-06 ENCOUNTER — Emergency Department (HOSPITAL_COMMUNITY)
Admission: EM | Admit: 2019-02-06 | Discharge: 2019-02-06 | Disposition: A | Discharge status: Home / Self Care | Payer: BLUE CROSS/BLUE SHIELD | Attending: Emergency Medicine | Admitting: Emergency Medicine

## 2019-02-06 DIAGNOSIS — J45909 Unspecified asthma, uncomplicated: Secondary | ICD-10-CM | POA: Insufficient documentation

## 2019-02-06 DIAGNOSIS — S01512A Laceration without foreign body of oral cavity, initial encounter: Secondary | ICD-10-CM

## 2019-02-06 DIAGNOSIS — Z91040 Latex allergy status: Secondary | ICD-10-CM | POA: Insufficient documentation

## 2019-02-06 DIAGNOSIS — Y9389 Activity, other specified: Secondary | ICD-10-CM | POA: Insufficient documentation

## 2019-02-06 DIAGNOSIS — Y999 Unspecified external cause status: Secondary | ICD-10-CM | POA: Insufficient documentation

## 2019-02-06 DIAGNOSIS — Z91010 Allergy to peanuts: Secondary | ICD-10-CM | POA: Insufficient documentation

## 2019-02-06 DIAGNOSIS — K029 Dental caries, unspecified: Secondary | ICD-10-CM | POA: Insufficient documentation

## 2019-02-06 DIAGNOSIS — Y929 Unspecified place or not applicable: Secondary | ICD-10-CM | POA: Insufficient documentation

## 2019-02-06 MED ORDER — IBUPROFEN 400 MG PO TABS
600.0000 mg | ORAL_TABLET | Freq: Once | ORAL | Status: AC
  Administered 2019-02-06: 200 mg via ORAL
  Filled 2019-02-06: qty 1

## 2019-02-06 MED ORDER — PENICILLIN V POTASSIUM 500 MG PO TABS: 500.0000 mg | ORAL_TABLET | Freq: Three times a day (TID) | ORAL | 0 refills | Status: AC

## 2019-02-06 NOTE — Discharge Instructions (Signed)
Take penicillin as prescribed and complete the full course. You may take Motrin and Tylenol as needed as directed for pain and swelling. Apply an ice pack to your mouth for 20 minutes at a time today. Rinse and spit with salt water or Listerine after every meal.
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
## 2019-04-23 NOTE — L&D Delivery Note (Signed)
SVD living female infant.  OA, right shoulder anterior.  Placenta delivered spont, intact.  Perineum intact.    Counts correct.  EBL=100cc    Delivery Summary    Patient: Marissa Welch MRN: [id number]  SSN: [id number]    Date of Birth: [birth year]  Age: 24 y.o.  Sex: female       Information for the patient's newborn:  Elliannah, Wayment [[id number]]       Labor Events:   Preterm Labor: No   Antenatal Steroids: None   Cervical Ripening Date/Time:       Cervical Ripening Type: None   Antibiotics During Labor: Yes   Rupture Identifier:      Rupture Date/Time: 12/30/2019 1:25 PM   Rupture Type: AROM   Amniotic Fluid Volume:      Amniotic Fluid Description: Clear    Amniotic Fluid Odor:      Induction: None       Induction Date/Time:        Indications for Induction:      Augmentation:     Augmentation Date/Time:      Indications for Augmentation:     Labor complications:         Additional complications:        Delivery Events:  Indications For Episiotomy:     Episiotomy:     Perineal Laceration(s):     Repaired:     Periurethral Laceration Location:      Repaired:     Labial Laceration Location:     Repaired:     Sulcal Laceration Location:     Repaired:     Vaginal Laceration Location:     Repaired:     Cervical Laceration Location:     Repaired:     Repair Suture:     Number of Repair Packets:     Estimated Blood Loss (ml):  ml   Quantitative Blood Loss (ml)                Delivery Date: 12/30/2019    Delivery Time: 7:26 PM  Delivery Type:    Sex:  Female    Gestational Age: [redacted]w[redacted]d   Delivery Clinician:     Living Status: Living   Delivery Location: L&D            APGARS  One minute Five minutes Ten minutes   Skin color:            Heart rate:            Grimace:            Muscle tone:            Breathing:            Totals:                Presentation:      Position:        Resuscitation Method:        Meconium Stained:        Cord Information:    Complications:    Cord around:    Delayed cord clamping?    Cord  clamped date/time:   Disposition of Cord Blood: Lab    Blood Gases Sent?:      Placenta:  Date/Time:    Removal:        Appearance:       Newborn Measurements:  Birth Weight:        Birth Length:  Head Circumference:        Chest Circumference:       Abdominal Girth:      Other Providers:    , Obstetrician;Primary Nurse;Primary Newborn Nurse;Nicu Nurse;Neonatologist;Anesthesiologist;Crna;Nurse Practitioner;Midwife;Nursery Nurse           Group B Strep:   Lab Results   Component Value Date/Time    GrBStrep, External Negative 06/26/2012 12:00 AM     Information for the patient's newborn:  Andrea, Ferrer [[id number]]   No results found for: ABORH, PCTABR, PCTDIG, BILI, ABORHEXT, ABORH     No results for input(s): PCO2CB, PO2CB, HCO3I, SO2I, IBD, PTEMPI, SPECTI, PHICB, ISITE, IDEV, IALLEN in the last 72 hours.

## 2019-05-28 ENCOUNTER — Inpatient Hospital Stay: Admit: 2019-06-01 | Payer: BLUE CROSS/BLUE SHIELD

## 2019-05-28 ENCOUNTER — Ambulatory Visit: Admit: 2019-05-28 | Discharge: 2019-05-28 | Payer: BLUE CROSS/BLUE SHIELD

## 2019-05-28 ENCOUNTER — Ambulatory Visit: Admit: 2019-05-28 | Discharge: 2019-05-28 | Payer: BLUE CROSS/BLUE SHIELD | Attending: Obstetrics & Gynecology

## 2019-05-28 ENCOUNTER — Encounter: Admit: 2019-05-28 | Payer: BLUE CROSS/BLUE SHIELD

## 2019-05-28 ENCOUNTER — Encounter

## 2019-05-28 ENCOUNTER — Ambulatory Visit: Attending: Obstetrics & Gynecology

## 2019-05-28 DIAGNOSIS — Z3480 Encounter for supervision of other normal pregnancy, unspecified trimester: Secondary | ICD-10-CM

## 2019-05-28 DIAGNOSIS — Z3490 Encounter for supervision of normal pregnancy, unspecified, unspecified trimester: Secondary | ICD-10-CM

## 2019-05-28 NOTE — Progress Notes (Signed)
Current pregnancy history:    Marissa Welch is a 24 y.o. female who presents for the evaluation of pregnancy.    Patient's last menstrual period was 03/29/2019 (exact date).    LMP history:  The date of her LMP is certain. Her menstrual cycles are regular and occur approximately every 28 days and range from 3 to 5 days. The last menses did last the usual number of days. A urine pregnancy test was positive 4 weeks ago. She was not on the pill at conception.     Based on her LMP her EGA is 8 weeks and 4 days giving an [place] of 01/03/20.      Ultrasound data:  She had an ultrasound done by the ultrasound tech today which revealed a viable singleton pregnancy with a gestational age of [redacted] weeks and 4 days giving an [place] of 01/04/20.    Ultrasound details:    TV ULTRASOUND PERFORMED  A SINGLE VIABLE [redacted]W[redacted]D IUP IS SEEN WITH NORMAL CARDIAC RHYTHM.  GESTATIONAL AGE BASED ON TODAYS ULTRASOUND.  A NORMAL YOLK SAC IS SEEN.  RIGHT OVARY IS NOT SEEN DUE TO BOWEL GAS. THE RIGHT ADNEXA APPEARS WNL.  LEFT OVARY APPEARS TO HAVE A CL CYST.  NO FREE FLUID SEEN IN THE CDS.    Pregnancy symptoms:    Since her LMP she has experienced  urinary frequency, breast tenderness, fatigue and nausea.   She has been vomiting over the last few weeks. Yesterday was the first day she was able to keep anything down.  Associated signs and symptoms which she denies: dysuria, discharge, vaginal bleeding.    She states she has not gained weight.    Relevant past pregnancy history:  She has the following pregnancy history: 2 SVD, 1 ectopic  She has no history of preterm delivery.    Relevant past medical history:(relevant to this pregnancy): noncontributory.       Pap/Occupational history:  Last pap smear: none on file      Her occupation is: works from home for [employer].     Substance history:  Negative for alcohol, tobacco and street drugs.  Positive for nothing.  Exposure history: There is/are no indoor cat/s in the home.  The patient was instructed to not  change the cat litter.   She admits close contact with children on a regular basis.   She has had chicken pox or the vaccine in the past.   Patient denies issues with domestic violence.     Genetic Screening/Teratology Counseling: (Includes patient, baby's father, or anyone in either family with:)  23.  Patient's age >/= 68 at [facility]?-- no  .   2.  Thalassemia ([place], [place], [place], or Asian background): MCV<80?--no.     3.  Neural tube defect (meningomyelocele, spina bifida, anencephaly)?--no.   4.  Congenital heart defect?--no.  5.  Down syndrome?--no.   6.  Tay-Sachs (Jewish, [place] Canadian)?--no.   7.  Canavan's Disease?--no.   8.  Familial Dysautonomia?--no.   9.  Sickle cell disease or trait (African)?--no   The patient has not been tested for sickle trait  10.  Hemophilia or other blood disorders?--no.   11.  Muscular dystrophy?--no.  12.  Cystic fibrosis?--no.  13.  Huntington's Chorea?--no.  14.  Mental retardation/autism (if yes was person tested for Fragile X)?--no.   15.  Other inherited genetic or chromosomal disorder?--no.   44.  Maternal metabolic disorder (DM, PKU, etc)?--no.  17.  Patient or FOB with a child with a  birth defect not listed above?--no.  17a.  Patient or FOB with a birth defect themselves?--no.   18.  Recurrent pregnancy loss, or stillbirth?--no.  19.  Any medications since LMP other than prenatal vitamins (include vitamins,  supplements, OTC meds, drugs, alcohol)?--no.  20.  Any other genetic/environmental exposure to discuss?--no.    Infection History:  1.  Lives with someone with TB or TB exposed?--no.   2.  Patient or partner has history of genital herpes?--no.  3.  Rash or viral illness since LMP?--no.    4.  History of STD (GC, CT, HPV, syphilis, HIV)?--no   5. Other: OTHER?      Past Medical History:   Diagnosis Date   ??? Asthma      History reviewed. No pertinent surgical history.  Social History     Occupational History   ??? Not on file   Tobacco Use   ??? Smoking status:  Never Smoker   ??? Smokeless tobacco: Never Used   Substance and Sexual Activity   ??? Alcohol use: No   ??? Drug use: Never   ??? Sexual activity: Yes     Partners: Male     Birth control/protection: None     Family History   Problem Relation Age of Onset   ??? Heart Disease Mother    ??? Diabetes Paternal Grandfather    ??? Breast Cancer Maternal Grandmother    ??? Hypertension Maternal Grandmother        Allergies   Allergen Reactions   ??? Peanut Anaphylaxis     Prior to Admission medications    Medication Sig Start Date End Date Taking? Authorizing Provider   cyclobenzaprine HCl (CYCLOBENZAPRINE PO) Take  by mouth as needed.   Yes Provider, Historical   prenatal vit-iron fumarate-fa (PRENATAL VITAMIN WITH MINERALS) 28-0.8 mg tab Take 1 Tab by mouth daily. 06/30/12   Jamal Collin, MD        Review of Systems: History obtained from the patient  Constitutional: negative for weight loss, fever, night sweats  HEENT: negative for hearing loss, earache, congestion, snoring, sorethroat  CV: negative for chest pain, palpitations, edema  Resp: negative for cough, shortness of breath, wheezing  Breast: negative for breast lumps, nipple discharge, galactorrhea  GI: negative for change in bowel habits, abdominal pain, black or bloody stools  GU: negative for frequency, dysuria, hematuria, vaginal discharge  MSK: negative for back pain, joint pain, muscle pain  Skin: negative for itching, rash, hives  Neuro: negative for dizziness, headache, confusion, weakness  Psych: negative for anxiety, depression, change in mood  Heme/lymph: negative for bleeding, bruising, pallor    Objective:  Visit Vitals  BP 106/60   Ht 5\' 3"  (1.6 m)   Wt 177 lb (80.3 kg)   LMP 03/29/2019 (Exact Date)   BMI 31.35 kg/m??       Physical Exam:   PHYSICAL EXAMINATION    Constitutional  ?? Appearance: well-nourished, well developed, alert, in no acute distress    HENT  ?? Head  ?? Face: appears normal  ?? Eyes: appear normal  ?? Ears: normal  ?? Mouth: normal  ?? Lips: no  lesions    Neck  ?? Inspection/Palpation: normal appearance, no masses or tenderness  ?? Lymph Nodes: no lymphadenopathy present  ?? Thyroid: gland size normal, nontender, no nodules or masses present on palpation    Chest  ?? Respiratory Effort: breathing unlabored  ?? Auscultation: normal breath sounds    Cardiovascular  ?? Heart:  ??  Auscultation: regular rate and rhythm without murmur    Breasts  ?? Inspection of Breasts: breasts symmetrical, no skin changes, no discharge present, nipple appearance normal, no skin retraction present  ?? Palpation of Breasts and Axillae: no masses present on palpation, no breast tenderness  ?? Axillary Lymph Nodes: no lymphadenopathy present    Gastrointestinal  ?? Abdominal Examination: abdomen non-tender to palpation, normal bowel sounds, no masses present  ?? Liver and spleen: no hepatomegaly present, spleen not palpable  ?? Hernias: no hernias identified    Genitourinary  ?? External Genitalia: normal appearance for age, no discharge present, no tenderness present, no inflammatory lesions present, no masses present, no atrophy present  ?? Vagina: normal vaginal vault without central or paravaginal defects, no discharge present, no inflammatory lesions present, no masses present  ?? Bladder: non-tender to palpation  ?? Urethra: appears normal  ?? Cervix: normal   ?? Uterus: enlarged, normal shape, soft  ?? Adnexa: no adnexal tenderness present, no adnexal masses present  ?? Perineum: perineum within normal limits, no evidence of trauma, no rashes or skin lesions present  ?? Anus: anus within normal limits, no hemorrhoids present  ?? Inguinal Lymph Nodes: no lymphadenopathy present    Skin  ?? General Inspection: no rash, no lesions identified    Neurologic/Psychiatric  ?? Mental Status:  ?? Orientation: grossly oriented to person, place and time  ?? Mood and Affect: mood normal, affect appropriate    Assessment:   Intrauterine pregnancy with the following problems identified: none.        Plan:      Offered CF testing, CVS, Nuchal Translucency, MSAFP, amnio, and discussed NIPT  Course of pregnancy discussed including visit schedule, routine U/S, glucola testing, etc.  Avoid alcoholic beverages and illicit/recreational drugs use  Take prenatal vitamins or folic acid daily.  Hospital and practice style discussed with coverage system.  Discussed nutrition, toxoplasmosis precautions, sexual activity, exercise, need for influenza vaccine, environmental and work hazards, travel advice, screen for domestic violence, need for seat belts.  Discussed seafood, unpasteurized dairy products, deli meat, artificial sweeteners, and caffeine.  Information on prenatal classes/breastfeeding given.  Information on circumcision given  Patient encouraged not to smoke.  Discussed current prescription drug use. Given medication list.  Discussed the use of over the counter medications and chemicals.    Pt understands risk of hemorrhage during pregnancy and post delivery and would accept blood products if necessary in life-threatening emergencies      Handouts given to pt.

## 2019-05-28 NOTE — Addendum Note (Signed)
Addendum  Note by Rhys Martini at 05/28/19 1500                Author: Rhys Martini  Service: --  Author Type: Technician       Filed: 05/28/19 1557  Encounter Date: 05/28/2019  Status: Signed          Editor: Rhys Martini (Technician)          Addended by: Rhys Martini on: 05/28/2019 03:57 PM    Modules accepted: Orders

## 2019-05-28 NOTE — Progress Notes (Signed)
Current pregnancy history:    Marissa Welch is a 24 y.o. female who presents for the evaluation of pregnancy.    Patient's last menstrual period was 03/29/2019 (exact date).    LMP history:  The date of her LMP is certain. Her menstrual cycles are regular and occur approximately every 28 days and range from 3 to 5 days. The last menses did last the usual number of days. A urine pregnancy test was positive 4 weeks ago. She was not on the pill at conception.     Based on her LMP her EGA is 8 weeks and 4 days giving an [place] of 01/03/20.      Ultrasound data:  She had an ultrasound done by the ultrasound tech today which revealed a viable singleton pregnancy with a gestational age of [redacted] weeks and 4 days giving an [place] of 01/04/20.    Ultrasound details:    TV ULTRASOUND PERFORMED  A SINGLE VIABLE [redacted]W[redacted]D IUP IS SEEN WITH NORMAL CARDIAC RHYTHM.  GESTATIONAL AGE BASED ON TODAYS ULTRASOUND.  A NORMAL YOLK SAC IS SEEN.  RIGHT OVARY IS NOT SEEN DUE TO BOWEL GAS. THE RIGHT ADNEXA APPEARS WNL.  LEFT OVARY APPEARS TO HAVE A CL CYST.  NO FREE FLUID SEEN IN THE CDS.    Pregnancy symptoms:    Since her LMP she has experienced  urinary frequency, breast tenderness, fatigue and nausea.   She has been vomiting over the last few weeks. Yesterday was the first day she was able to keep anything down.  Associated signs and symptoms which she denies: dysuria, discharge, vaginal bleeding.    She states she has not gained weight.    Relevant past pregnancy history:  She has the following pregnancy history: 2 SVD, 1 ectopic  She has no history of preterm delivery.    Relevant past medical history:(relevant to this pregnancy): noncontributory.       Pap/Occupational history:  Last pap smear: none on file      Her occupation is: works from home for [employer].     Substance history:  Negative for alcohol, tobacco and street drugs.  Positive for nothing.  Exposure history: There is/are no indoor cat/s in the home.   The patient was instructed to not change the cat litter.   She admits close contact with children on a regular basis.   She has had chicken pox or the vaccine in the past.   Patient denies issues with domestic violence.     Genetic Screening/Teratology Counseling: (Includes patient, baby's father, or anyone in either family with:)  27.  Patient's age >/= 34 at [facility]?-- no  .   2.  Thalassemia ([place], [place], [place], or Asian background): MCV<80?--no.     3.  Neural tube defect (meningomyelocele, spina bifida, anencephaly)?--no.   4.  Congenital heart defect?--no.  5.  Down syndrome?--no.   6.  Tay-Sachs (Jewish, [place] Canadian)?--no.   7.  Canavan's Disease?--no.   8.  Familial Dysautonomia?--no.   9.  Sickle cell disease or trait (African)?--no   The patient has not been tested for sickle trait  10.  Hemophilia or other blood disorders?--no.   11.  Muscular dystrophy?--no.  12.  Cystic fibrosis?--no.  13.  Huntington's Chorea?--no.  14.  Mental retardation/autism (if yes was person tested for Fragile X)?--no.   15.  Other inherited genetic or chromosomal disorder?--no.   44.  Maternal metabolic disorder (DM, PKU, etc)?--no.  17.  Patient or FOB with a child with a  birth defect not listed above?--no.  17a.  Patient or FOB with a birth defect themselves?--no.   18.  Recurrent pregnancy loss, or stillbirth?--no.  19.  Any medications since LMP other than prenatal vitamins (include vitamins,  supplements, OTC meds, drugs, alcohol)?--no.  20.  Any other genetic/environmental exposure to discuss?--no.    Infection History:  1.  Lives with someone with TB or TB exposed?--no.   2.  Patient or partner has history of genital herpes?--no.  3.  Rash or viral illness since LMP?--no.    4.  History of STD (GC, CT, HPV, syphilis, HIV)?--no   5. Other: OTHER?      Past Medical History:   Diagnosis Date   ??? Asthma      History reviewed. No pertinent surgical history.  Social History     Occupational History   ??? Not on file    Tobacco Use   ??? Smoking status: Never Smoker   ??? Smokeless tobacco: Never Used   Substance and Sexual Activity   ??? Alcohol use: No   ??? Drug use: Never   ??? Sexual activity: Yes     Partners: Male     Birth control/protection: None     Family History   Problem Relation Age of Onset   ??? Heart Disease Mother    ??? Diabetes Paternal Grandfather    ??? Breast Cancer Maternal Grandmother    ??? Hypertension Maternal Grandmother        Allergies   Allergen Reactions   ??? Peanut Anaphylaxis     Prior to Admission medications    Medication Sig Start Date End Date Taking? Authorizing Provider   cyclobenzaprine HCl (CYCLOBENZAPRINE PO) Take  by mouth as needed.   Yes Provider, Historical   prenatal vit-iron fumarate-fa (PRENATAL VITAMIN WITH MINERALS) 28-0.8 mg tab Take 1 Tab by mouth daily. 06/30/12   Jamal Collin, MD        Review of Systems: History obtained from the patient  Constitutional: negative for weight loss, fever, night sweats  HEENT: negative for hearing loss, earache, congestion, snoring, sorethroat  CV: negative for chest pain, palpitations, edema  Resp: negative for cough, shortness of breath, wheezing  Breast: negative for breast lumps, nipple discharge, galactorrhea  GI: negative for change in bowel habits, abdominal pain, black or bloody stools  GU: negative for frequency, dysuria, hematuria, vaginal discharge  MSK: negative for back pain, joint pain, muscle pain  Skin: negative for itching, rash, hives  Neuro: negative for dizziness, headache, confusion, weakness  Psych: negative for anxiety, depression, change in mood  Heme/lymph: negative for bleeding, bruising, pallor    Objective:  Visit Vitals  BP 106/60   Ht 5\' 3"  (1.6 m)   Wt 177 lb (80.3 kg)   LMP 03/29/2019 (Exact Date)   BMI 31.35 kg/m??       Physical Exam:   PHYSICAL EXAMINATION    Constitutional  ?? Appearance: well-nourished, well developed, alert, in no acute distress    HENT  ?? Head  ?? Face: appears normal  ?? Eyes: appear normal  ?? Ears: normal   ?? Mouth: normal  ?? Lips: no lesions    Neck  ?? Inspection/Palpation: normal appearance, no masses or tenderness  ?? Lymph Nodes: no lymphadenopathy present  ?? Thyroid: gland size normal, nontender, no nodules or masses present on palpation    Chest  ?? Respiratory Effort: breathing unlabored  ?? Auscultation: normal breath sounds    Cardiovascular  ?? Heart:  ??  Auscultation: regular rate and rhythm without murmur    Breasts  ?? Inspection of Breasts: breasts symmetrical, no skin changes, no discharge present, nipple appearance normal, no skin retraction present  ?? Palpation of Breasts and Axillae: no masses present on palpation, no breast tenderness  ?? Axillary Lymph Nodes: no lymphadenopathy present    Gastrointestinal  ?? Abdominal Examination: abdomen non-tender to palpation, normal bowel sounds, no masses present  ?? Liver and spleen: no hepatomegaly present, spleen not palpable  ?? Hernias: no hernias identified    Genitourinary  ?? External Genitalia: normal appearance for age, no discharge present, no tenderness present, no inflammatory lesions present, no masses present, no atrophy present  ?? Vagina: normal vaginal vault without central or paravaginal defects, no discharge present, no inflammatory lesions present, no masses present  ?? Bladder: non-tender to palpation  ?? Urethra: appears normal  ?? Cervix: normal   ?? Uterus: enlarged, normal shape, soft  ?? Adnexa: no adnexal tenderness present, no adnexal masses present  ?? Perineum: perineum within normal limits, no evidence of trauma, no rashes or skin lesions present  ?? Anus: anus within normal limits, no hemorrhoids present  ?? Inguinal Lymph Nodes: no lymphadenopathy present    Skin  ?? General Inspection: no rash, no lesions identified    Neurologic/Psychiatric  ?? Mental Status:  ?? Orientation: grossly oriented to person, place and time  ?? Mood and Affect: mood normal, affect appropriate    Assessment:    Intrauterine pregnancy with the following problems identified: none.        Plan:     Offered CF testing, CVS, Nuchal Translucency, MSAFP, amnio, and discussed NIPT  Course of pregnancy discussed including visit schedule, routine U/S, glucola testing, etc.  Avoid alcoholic beverages and illicit/recreational drugs use  Take prenatal vitamins or folic acid daily.  Hospital and practice style discussed with coverage system.  Discussed nutrition, toxoplasmosis precautions, sexual activity, exercise, need for influenza vaccine, environmental and work hazards, travel advice, screen for domestic violence, need for seat belts.  Discussed seafood, unpasteurized dairy products, deli meat, artificial sweeteners, and caffeine.  Information on prenatal classes/breastfeeding given.  Information on circumcision given  Patient encouraged not to smoke.  Discussed current prescription drug use. Given medication list.  Discussed the use of over the counter medications and chemicals.    Pt understands risk of hemorrhage during pregnancy and post delivery and would accept blood products if necessary in life-threatening emergencies      Handouts given to pt.

## 2019-05-28 NOTE — Addendum Note (Signed)
Addended by: Rhys Martini on: 05/28/2019 03:57 PM     Modules accepted: Orders

## 2019-05-29 LAB — TYPE & SCREEN
ABO/Rh(D): O POS
Antibody screen: NEGATIVE

## 2019-05-29 LAB — TYPE AND SCREEN
ABO/Rh: O POS
Antibody Screen: NEGATIVE

## 2019-05-30 LAB — CULTURE, URINE
Colonies Counted: 80000
Colony Count: 80000

## 2019-05-31 LAB — CBC W/O DIFF
ABSOLUTE NRBC: 0 10*3/uL (ref 0.00–0.01)
HCT: 35.5 % (ref 35.0–47.0)
HGB: 11.6 g/dL (ref 11.5–16.0)
MCH: 28.2 PG (ref 26.0–34.0)
MCHC: 32.7 g/dL (ref 30.0–36.5)
MCV: 86.2 FL (ref 80.0–99.0)
MPV: 9.7 FL (ref 8.9–12.9)
NRBC: 0 PER 100 WBC
PLATELET: 400 10*3/uL (ref 150–400)
RBC: 4.12 M/uL (ref 3.80–5.20)
RDW: 12.9 % (ref 11.5–14.5)
WBC: 10.5 10*3/uL (ref 3.6–11.0)

## 2019-05-31 LAB — RPR
RPR: NONREACTIVE
RPR: NONREACTIVE

## 2019-05-31 LAB — RUBELLA AB, IGG
RUBELLA, IGG QT.: 34.1 IU/ML
Rubella IgG, QL: REACTIVE
Rubella, IgG Qt.: 34.1 IU/ML
Rubella, Qualitative, IgG: REACTIVE

## 2019-05-31 LAB — HEP B SURFACE AG
Hep B surface Ag Interp.: NEGATIVE
Hepatitis B surface Ag: <0.1 Index

## 2019-05-31 LAB — HIV 1/2 AG/AB, 4TH GENERATION,W RFLX CONFIRM: HIV 1/2 Interpretation: NONREACTIVE

## 2019-05-31 LAB — CBC
Hematocrit: 35.5 % (ref 35.0–47.0)
Hemoglobin: 11.6 g/dL (ref 11.5–16.0)
MCH: 28.2 PG (ref 26.0–34.0)
MCHC: 32.7 g/dL (ref 30.0–36.5)
MCV: 86.2 FL (ref 80.0–99.0)
MPV: 9.7 FL (ref 8.9–12.9)
NRBC Absolute: 0 10*3/uL (ref 0.00–0.01)
Nucleated RBCs: 0 PER 100 WBC
Platelets: 400 10*3/uL (ref 150–400)
RBC: 4.12 M/uL (ref 3.80–5.20)
RDW: 12.9 % (ref 11.5–14.5)
WBC: 10.5 10*3/uL (ref 3.6–11.0)

## 2019-05-31 LAB — HIV 1/2 ANTIGEN/ANTIBODY, FOURTH GENERATION W/RFL: Interpretation: NONREACTIVE

## 2019-05-31 LAB — HEPATITIS B SURFACE ANTIGEN: Hepatitis B Surface Ag: <0.1 Index

## 2019-06-02 LAB — CHLAMYDIA/NEISSERIA/TRICHOMONAS AMP
Chlamydia amplification: NEGATIVE
Chlamydia trachomatis, NAA: NEGATIVE
N. gonorrhoeae amplification: NEGATIVE
Neisseria Gonorrhoeae, NAA: NEGATIVE
TRICHOMONAS AMPLIFICATION, TVAMP: NEGATIVE
Trichomonas amplification: NEGATIVE

## 2019-06-28 ENCOUNTER — Encounter: Attending: Obstetrics & Gynecology

## 2019-07-02 ENCOUNTER — Ambulatory Visit: Admit: 2019-07-02 | Discharge: 2019-07-02 | Payer: BLUE CROSS/BLUE SHIELD | Attending: Obstetrics & Gynecology

## 2019-07-02 ENCOUNTER — Ambulatory Visit: Attending: Obstetrics & Gynecology

## 2019-07-02 DIAGNOSIS — Z3480 Encounter for supervision of other normal pregnancy, unspecified trimester: Secondary | ICD-10-CM

## 2019-07-02 NOTE — Progress Notes (Signed)
Panorama today; declines Horizon; given info on cervical dysplasia

## 2019-07-02 NOTE — Progress Notes (Signed)
The test sent for Down syndrome is normal/negative

## 2019-07-02 NOTE — Progress Notes (Signed)
Panorama today; declines Horizon; given info on cervical dysplasia

## 2019-07-23 ENCOUNTER — Ambulatory Visit: Admit: 2019-07-23 | Discharge: 2019-07-23 | Payer: BLUE CROSS/BLUE SHIELD | Attending: Obstetrics & Gynecology

## 2019-07-23 ENCOUNTER — Ambulatory Visit: Attending: Obstetrics & Gynecology

## 2019-07-23 DIAGNOSIS — Z3480 Encounter for supervision of other normal pregnancy, unspecified trimester: Secondary | ICD-10-CM

## 2019-07-23 NOTE — Progress Notes (Signed)
Declines AFP; US in 4 weeks

## 2019-08-18 ENCOUNTER — Encounter: Admit: 2019-08-18 | Payer: BLUE CROSS/BLUE SHIELD

## 2019-08-18 ENCOUNTER — Ambulatory Visit: Admit: 2019-08-18 | Discharge: 2019-08-18 | Payer: BLUE CROSS/BLUE SHIELD | Attending: Obstetrics & Gynecology

## 2019-08-18 ENCOUNTER — Ambulatory Visit: Admit: 2019-08-18 | Discharge: 2019-08-18 | Payer: BLUE CROSS/BLUE SHIELD

## 2019-08-18 ENCOUNTER — Encounter

## 2019-08-18 ENCOUNTER — Ambulatory Visit: Attending: Obstetrics & Gynecology

## 2019-08-18 DIAGNOSIS — Z3480 Encounter for supervision of other normal pregnancy, unspecified trimester: Secondary | ICD-10-CM

## 2019-08-18 DIAGNOSIS — Z363 Encounter for antenatal screening for malformations: Secondary | ICD-10-CM

## 2019-08-18 NOTE — Progress Notes (Signed)
FETAL SURVEY  A SINGLE VIABLE IUP AT [redacted]W[redacted]D GA BY LMP IS SEEN. FETAL CARDIAC MOTION OBSERVED.  FETAL ANATOMY WELL VISUALIZED AND APPEARS WITHIN NORMAL LIMITS.  NO ABNORMALITIES ARE SEEN ON TODAY'S EXAM.  APPROPRIATE FETAL GROWTH IS SEEN. SIZE=DATES.  AFI, CERVIX AND PLACENTA APPEAR WITHIN NORMAL LIMITS.  GENDER: FEMALE    [place] normal

## 2019-08-18 NOTE — Progress Notes (Signed)
FETAL SURVEY  A SINGLE VIABLE IUP AT [redacted]W[redacted]D GA BY LMP IS SEEN. FETAL CARDIAC MOTION OBSERVED.  FETAL ANATOMY WELL VISUALIZED AND APPEARS WITHIN NORMAL LIMITS.  NO ABNORMALITIES ARE SEEN ON TODAY'S EXAM.  APPROPRIATE FETAL GROWTH IS SEEN. SIZE=DATES.  AFI, CERVIX AND PLACENTA APPEAR WITHIN NORMAL LIMITS.  GENDER: FEMALE    [place] normal

## 2019-09-15 ENCOUNTER — Encounter: Attending: Obstetrics & Gynecology

## 2019-10-13 ENCOUNTER — Ambulatory Visit: Admit: 2019-10-13 | Discharge: 2019-10-13 | Payer: MEDICAID | Attending: Obstetrics & Gynecology

## 2019-10-13 ENCOUNTER — Ambulatory Visit: Attending: Obstetrics & Gynecology

## 2019-10-13 DIAGNOSIS — Z3480 Encounter for supervision of other normal pregnancy, unspecified trimester: Secondary | ICD-10-CM

## 2019-10-13 NOTE — Progress Notes (Signed)
After obtaining consent, and per orders of Dr Howell, injection of Tdap given in left deltoid by Tonia A Threlkeld, LPN. Patient instructed to remain in clinic for 20 minutes afterwards, and to report any adverse reaction to me immediately. VIS given.

## 2019-10-13 NOTE — Progress Notes (Signed)
Your blood sugar test is normal. You are a little anemic. Be sure to take your prenatal vitamins every day and take extra plain iron once or twice a day.

## 2019-10-13 NOTE — Progress Notes (Signed)
Tdap today; glucola today

## 2019-10-14 LAB — CBC W/O DIFF
HCT: 32.2 % — ABNORMAL LOW (ref 34.0–46.6)
HGB: 10.2 g/dL — ABNORMAL LOW (ref 11.1–15.9)
MCH: 27.7 pg (ref 26.6–33.0)
MCHC: 31.7 g/dL (ref 31.5–35.7)
MCV: 88 fL (ref 79–97)
PLATELET: 326 10*3/uL (ref 150–450)
RBC: 3.68 x10E6/uL — ABNORMAL LOW (ref 3.77–5.28)
RDW: 12.5 % (ref 11.7–15.4)
WBC: 12.6 10*3/uL — ABNORMAL HIGH (ref 3.4–10.8)

## 2019-10-14 LAB — GLUCOSE, GESTATIONAL 1 HR TOLERANCE
Gestational Diabetes Screen: 101 mg/dL (ref 65–139)
Gestational Diabetes Screen: 101 mg/dL (ref 65–139)

## 2019-10-14 LAB — CBC
Hematocrit: 32.2 % — ABNORMAL LOW (ref 34.0–46.6)
Hemoglobin: 10.2 g/dL — ABNORMAL LOW (ref 11.1–15.9)
MCH: 27.7 pg (ref 26.6–33.0)
MCHC: 31.7 g/dL (ref 31.5–35.7)
MCV: 88 fL (ref 79–97)
Platelets: 326 10*3/uL (ref 150–450)
RBC: 3.68 x10E6/uL — ABNORMAL LOW (ref 3.77–5.28)
RDW: 12.5 % (ref 11.7–15.4)
WBC: 12.6 10*3/uL — ABNORMAL HIGH (ref 3.4–10.8)

## 2019-10-27 ENCOUNTER — Ambulatory Visit: Admit: 2019-10-27 | Discharge: 2019-10-27 | Payer: MEDICAID | Attending: Obstetrics & Gynecology

## 2019-10-27 ENCOUNTER — Ambulatory Visit: Attending: Obstetrics & Gynecology

## 2019-10-27 DIAGNOSIS — Z3480 Encounter for supervision of other normal pregnancy, unspecified trimester: Secondary | ICD-10-CM

## 2019-10-27 NOTE — Progress Notes (Signed)
Advised to take extra iron, [place] in 4 weeks for growth

## 2019-11-10 ENCOUNTER — Ambulatory Visit: Admit: 2019-11-10 | Discharge: 2019-11-10 | Payer: MEDICAID | Attending: Obstetrics & Gynecology

## 2019-11-10 ENCOUNTER — Ambulatory Visit: Attending: Obstetrics & Gynecology

## 2019-11-10 DIAGNOSIS — Z3480 Encounter for supervision of other normal pregnancy, unspecified trimester: Secondary | ICD-10-CM

## 2019-11-10 NOTE — Progress Notes (Signed)
Has [place] and FOB in 2 weeks

## 2019-12-01 ENCOUNTER — Encounter: Admit: 2019-12-01 | Payer: MEDICAID

## 2019-12-01 ENCOUNTER — Encounter

## 2019-12-01 ENCOUNTER — Ambulatory Visit: Admit: 2019-12-01 | Discharge: 2019-12-01 | Payer: MEDICAID | Attending: Obstetrics & Gynecology

## 2019-12-01 ENCOUNTER — Ambulatory Visit: Admit: 2019-12-01 | Discharge: 2019-12-01 | Payer: MEDICAID

## 2019-12-01 ENCOUNTER — Ambulatory Visit: Attending: Obstetrics & Gynecology

## 2019-12-01 DIAGNOSIS — Z362 Encounter for other antenatal screening follow-up: Secondary | ICD-10-CM

## 2019-12-01 DIAGNOSIS — Z3480 Encounter for supervision of other normal pregnancy, unspecified trimester: Secondary | ICD-10-CM

## 2019-12-01 NOTE — Progress Notes (Signed)
[place] Today:  LIMITED OB SCAN  A SINGLE VERTEX [redacted]W[redacted]D IUP IS SEEN. FETAL CARDIAC MOTION OBSERVED.  LIMITED ANATOMY WAS VISUALIZED AND APPEARS WNL.  APPROPRIATE FETAL GROWTH IS SEEN. SIZE=DATES.  AFI AND PLACENTA APPEAR WITHIN NORMAL LIMITS

## 2019-12-08 ENCOUNTER — Ambulatory Visit: Admit: 2019-12-08 | Discharge: 2019-12-08 | Payer: MEDICAID | Attending: Obstetrics & Gynecology

## 2019-12-08 ENCOUNTER — Ambulatory Visit: Attending: Obstetrics & Gynecology

## 2019-12-08 DIAGNOSIS — Z3480 Encounter for supervision of other normal pregnancy, unspecified trimester: Secondary | ICD-10-CM

## 2019-12-08 NOTE — Progress Notes (Signed)
GBS sent. Labor precuations

## 2019-12-10 LAB — SPECIMEN STATUS REPORT

## 2019-12-10 LAB — GROUP B STREP BY NAA
STREP GP B NAA: POSITIVE — AB
Strep Gp B NAA: POSITIVE — AB

## 2019-12-15 ENCOUNTER — Ambulatory Visit: Admit: 2019-12-15 | Discharge: 2019-12-15 | Payer: MEDICAID | Attending: Obstetrics & Gynecology

## 2019-12-15 ENCOUNTER — Ambulatory Visit: Attending: Obstetrics & Gynecology

## 2019-12-15 DIAGNOSIS — Z3480 Encounter for supervision of other normal pregnancy, unspecified trimester: Secondary | ICD-10-CM

## 2019-12-15 NOTE — Progress Notes (Signed)
Given info on Covid testing. Advised of + GBS

## 2019-12-22 ENCOUNTER — Ambulatory Visit: Admit: 2019-12-22 | Discharge: 2019-12-22 | Payer: MEDICAID | Attending: Obstetrics & Gynecology

## 2019-12-22 ENCOUNTER — Ambulatory Visit: Attending: Obstetrics & Gynecology

## 2019-12-22 DIAGNOSIS — Z3480 Encounter for supervision of other normal pregnancy, unspecified trimester: Secondary | ICD-10-CM

## 2019-12-22 NOTE — Progress Notes (Signed)
Labor precautions

## 2019-12-24 ENCOUNTER — Inpatient Hospital Stay: Admit: 2019-12-24 | Discharge: 2019-12-24 | Disposition: A | Discharge status: Home / Self Care | Payer: MEDICAID

## 2019-12-24 DIAGNOSIS — O26893 Other specified pregnancy related conditions, third trimester: Secondary | ICD-10-CM

## 2019-12-24 NOTE — Progress Notes (Signed)
G3P2 arrive to L & D c/o some spotting & losing some mucus plug, reports no issues this pregnancy, denies leaking fluid, reports fetal movement    0245- PO fluids given    0545- Dr. Evie Lacks in room to see patient    0604- Discharge instructions reviewed, pt verbalized understanding, has appointment Wednesday    0605- Pt discharged ambulatory with SO

## 2019-12-24 NOTE — H&P (Signed)
History & Physical    Name: Marissa Welch MRN: [id number]  SSN: [id number]    Date of Birth: 1995-10-04  Age: 24 y.o.  Sex: female        Subjective:     Estimated Date of Delivery: 01/03/20  OB History   Gravida Para Term Preterm AB Living   4 2 2   1 2    SAB TAB Ectopic Molar Multiple Live Births       1     2      # Outcome Date GA Lbr Len/2nd Weight Sex Delivery Anes PTL Lv   4 Current            3 Ectopic 09/2018     ECTOPIC         Birth Comments: methotrexate   2 Term 02/18/17 [redacted]w[redacted]d  3.43 kg F Vag-Spont   LIV   1 Term 07/21/12 [redacted]w[redacted]d  3.4 kg F VAGINAL DELI EPIDURAL AN N LIV      Obstetric Comments   Menarche:  12.   LMP: n/a.  # of Children:  1.  Age at Delivery of First Child:  67.   Hysterectomy/oophorectomy:  NO/NO.  Breast Bx:  no.  Hx of Breast Feeding:  yes.  BCP:  no. Hormone therapy:  no.       Marissa Welch is a Marissa Welch with  pregnancy at [redacted]w[redacted]d that came to labor and delivery because she passed her mucous plug.  Denies contractions, leakage of vaginal fluid, vaginal bleeding, and has good fetal movement.  Prenatal course was normal. Please see prenatal records for details.    Past Medical History:   Diagnosis Date   ??? Anxiety    ??? Asthma    ??? Depression      History reviewed. No pertinent surgical history.  Social History     Occupational History   ??? Not on file   Tobacco Use   ??? Smoking status: Never Smoker   ??? Smokeless tobacco: Never Used   Vaping Use   ??? Vaping Use: Never used   Substance and Sexual Activity   ??? Alcohol use: No   ??? Drug use: Never   ??? Sexual activity: Yes     Partners: Male     Birth control/protection: None     Family History   Problem Relation Age of Onset   ??? Heart Disease Mother    ??? Diabetes Paternal Grandfather    ??? Breast Cancer Maternal Grandmother    ??? Hypertension Maternal Grandmother    ??? Hypertension Maternal Grandfather    ??? Hypertension Paternal Grandmother        Allergies   Allergen Reactions   ??? Latex Rash   ??? Peanut Anaphylaxis     Prior to Admission medications     Medication Sig Start Date End Date Taking? Authorizing Provider   prenatal vit-iron fumarate-fa (PRENATAL VITAMIN WITH MINERALS) 28-0.8 mg tab Take 1 Tab by mouth daily. 06/30/12   08/30/12, MD        Review of Systems: A comprehensive review of systems was negative except for that written in the HPI.    Objective:     Vitals:  Vitals:    12/24/19 0234   BP: 116/79   Pulse: (!) 104   Resp: 16   Temp: 98.3 ??F (36.8 ??C)        Physical Exam:  Patient without distress.  Heart: Regular rate and rhythm or S1S2 present  Lung:  clear to auscultation throughout lung fields, no wheezes, no rales, no rhonchi and normal respiratory effort  Abdomen: soft, nontender, gravid  Fundus: soft and non tender  Perineum: blood absent, amniotic fluid absent  Cervical Exam: 3/50/-3 (exam by nurse)  Lower Extremities:  - Edema No  Membranes:  Intact  Fetal Heart Rate: Baseline: 145 per minute  Variability: moderate  Accelerations: yes  Decelerations: none  Uterine contractions: sporadic    Prenatal Labs:   Lab Results   Component Value Date/Time    ABO/Rh(D) O POSITIVE 05/28/2019 03:58 PM    Rubella, External Immune 12/30/2011 12:00 AM    GrBStrep, External Negative 06/26/2012 12:00 AM    HBsAg, External negative 12/30/2011 12:00 AM    HIV, External negative 12/30/2011 12:00 AM    RPR, External nonreactive 12/30/2011 12:00 AM    Gonorrhea, External negative 12/30/2011 12:00 AM    Chlamydia, External negative 12/30/2011 12:00 AM    ABO,Rh O Positive 12/30/2011 12:00 AM         Assessment/Plan:     G4 P2012 at 38 4/7 wks that passed her mucous plug, no evidence of labor    Labor precautions reviewed with the patient.  Follow up with scheduled OB appt.

## 2019-12-29 ENCOUNTER — Encounter: Payer: MEDICAID | Attending: Obstetrics & Gynecology

## 2019-12-30 ENCOUNTER — Inpatient Hospital Stay
Admit: 2019-12-30 | Discharge: 2020-01-01 | Disposition: A | Discharge status: Home / Self Care | Payer: MEDICAID | Attending: Obstetrics & Gynecology | Admitting: Obstetrics & Gynecology

## 2019-12-30 DIAGNOSIS — O9902 Anemia complicating childbirth: Secondary | ICD-10-CM

## 2019-12-30 LAB — COVID-19 RAPID TEST: COVID-19 rapid test: NOT DETECTED

## 2019-12-30 LAB — CBC WITH AUTOMATED DIFF
ABS. BASOPHILS: 0.2 10*3/uL — ABNORMAL HIGH (ref 0.0–0.1)
ABS. EOSINOPHILS: 0 10*3/uL (ref 0.0–0.4)
ABS. IMM. GRANS.: 0.6 10*3/uL — ABNORMAL HIGH (ref 0.00–0.04)
ABS. LYMPHOCYTES: 2 10*3/uL (ref 0.8–3.5)
ABS. MONOCYTES: 1.6 10*3/uL — ABNORMAL HIGH (ref 0.0–1.0)
ABS. NEUTROPHILS: 16 10*3/uL — ABNORMAL HIGH (ref 1.8–8.0)
ABSOLUTE NRBC: 0 10*3/uL (ref 0.00–0.01)
BASOPHILS: 1 % (ref 0–1)
EOSINOPHILS: 0 % (ref 0–7)
HCT: 33.2 % — ABNORMAL LOW (ref 35.0–47.0)
HGB: 9.9 g/dL — ABNORMAL LOW (ref 11.5–16.0)
IMMATURE GRANULOCYTES: 3 % — ABNORMAL HIGH (ref 0.0–0.5)
LYMPHOCYTES: 10 % — ABNORMAL LOW (ref 12–49)
MCH: 24.6 PG — ABNORMAL LOW (ref 26.0–34.0)
MCHC: 29.8 g/dL — ABNORMAL LOW (ref 30.0–36.5)
MCV: 82.4 FL (ref 80.0–99.0)
MONOCYTES: 8 % (ref 5–13)
MPV: 9.9 FL (ref 8.9–12.9)
NEUTROPHILS: 78 % — ABNORMAL HIGH (ref 32–75)
NRBC: 0 PER 100 WBC
PLATELET: 301 10*3/uL (ref 150–400)
RBC: 4.03 M/uL (ref 3.80–5.20)
RDW: 15.2 % — ABNORMAL HIGH (ref 11.5–14.5)
WBC COMMENTS: REACTIVE
WBC: 20.4 10*3/uL — ABNORMAL HIGH (ref 3.6–11.0)

## 2019-12-30 LAB — CBC WITH AUTO DIFFERENTIAL
Basophils %: 1 % (ref 0–1)
Basophils Absolute: 0.2 10*3/uL — ABNORMAL HIGH (ref 0.0–0.1)
Eosinophils %: 0 % (ref 0–7)
Eosinophils Absolute: 0 10*3/uL (ref 0.0–0.4)
Granulocyte Absolute Count: 0.6 10*3/uL — ABNORMAL HIGH (ref 0.00–0.04)
Hematocrit: 33.2 % — ABNORMAL LOW (ref 35.0–47.0)
Hemoglobin: 9.9 g/dL — ABNORMAL LOW (ref 11.5–16.0)
Immature Granulocytes: 3 % — ABNORMAL HIGH (ref 0.0–0.5)
Lymphocytes %: 10 % — ABNORMAL LOW (ref 12–49)
Lymphocytes Absolute: 2 10*3/uL (ref 0.8–3.5)
MCH: 24.6 PG — ABNORMAL LOW (ref 26.0–34.0)
MCHC: 29.8 g/dL — ABNORMAL LOW (ref 30.0–36.5)
MCV: 82.4 FL (ref 80.0–99.0)
MPV: 9.9 FL (ref 8.9–12.9)
Monocytes %: 8 % (ref 5–13)
Monocytes Absolute: 1.6 10*3/uL — ABNORMAL HIGH (ref 0.0–1.0)
NRBC Absolute: 0 10*3/uL (ref 0.00–0.01)
Neutrophils %: 78 % — ABNORMAL HIGH (ref 32–75)
Neutrophils Absolute: 16 10*3/uL — ABNORMAL HIGH (ref 1.8–8.0)
Nucleated RBCs: 0 PER 100 WBC
Platelets: 301 10*3/uL (ref 150–400)
RBC: 4.03 M/uL (ref 3.80–5.20)
RDW: 15.2 % — ABNORMAL HIGH (ref 11.5–14.5)
WBC Comment: REACTIVE
WBC: 20.4 10*3/uL — ABNORMAL HIGH (ref 3.6–11.0)

## 2019-12-30 LAB — COVID-19, RAPID: SARS-CoV-2, Rapid: NOT DETECTED

## 2019-12-30 MED ORDER — PENICILLIN G POT 2.5 MILLION UNITS IN 50 ML 0.9% NACL
2.5 MU | INTRAVENOUS | Status: DC
Start: 2019-12-30 — End: 2019-12-30
  Administered 2019-12-30 (×2): via INTRAVENOUS

## 2019-12-30 MED ORDER — SODIUM CHLORIDE 0.9 % IJ SYRG
Freq: Three times a day (TID) | INTRAMUSCULAR | Status: DC
Start: 2019-12-30 — End: 2019-12-30

## 2019-12-30 MED ORDER — DIPHTH,PERTUS(AC)TETANUS VAC(PF) 2.5 LF UNIT-8 MCG-5 LF/0.5 ML INJ
INTRAMUSCULAR | Status: DC
Start: 2019-12-30 — End: 2020-01-01

## 2019-12-30 MED ORDER — NALBUPHINE 10 MG/ML INJECTION
10 mg/mL | Freq: Four times a day (QID) | INTRAMUSCULAR | Status: DC | PRN
Start: 2019-12-30 — End: 2019-12-31

## 2019-12-30 MED ORDER — PENICILLIN G POTASSIUM 5,000,000 UNIT IJ SOLR
5 million unit | INTRAMUSCULAR | Status: AC
Start: 2019-12-30 — End: 2019-12-30
  Administered 2019-12-30: 13:00:00 via INTRAVENOUS

## 2019-12-30 MED ORDER — BUPIVACAINE (PF) 0.25 % (2.5 MG/ML) IJ SOLN
0.25 % (2.5 mg/mL) | INTRAMUSCULAR | Status: DC | PRN
Start: 2019-12-30 — End: 2019-12-30
  Administered 2019-12-30: 14:00:00 via INTRATHECAL

## 2019-12-30 MED ORDER — NALOXONE 0.4 MG/ML INJECTION
0.4 mg/mL | INTRAMUSCULAR | Status: DC | PRN
Start: 2019-12-30 — End: 2020-01-01

## 2019-12-30 MED ORDER — LACTATED RINGERS BOLUS IV
INTRAVENOUS | Status: DC | PRN
Start: 2019-12-30 — End: 2019-12-30

## 2019-12-30 MED ORDER — NALOXONE 0.4 MG/ML INJECTION
0.4 mg/mL | INTRAMUSCULAR | Status: DC | PRN
Start: 2019-12-30 — End: 2019-12-30

## 2019-12-30 MED ORDER — OXYTOCIN 30 UNIT IN 500 ML INFUSION
30 unit/500 mL | INTRAVENOUS | Status: DC | PRN
Start: 2019-12-30 — End: 2019-12-30

## 2019-12-30 MED ORDER — EPHEDRINE 50 MG/5 ML (10 MG/ML) IN NS IV SYRINGE
50 mg/5 mL (10 mg/mL) | Freq: Once | INTRAVENOUS | Status: AC | PRN
Start: 2019-12-30 — End: 2019-12-31

## 2019-12-30 MED ORDER — GLYCERIN-WITCH HAZEL 12.5 %-50 % TOPICAL PADS
CUTANEOUS | Status: DC | PRN
Start: 2019-12-30 — End: 2020-01-01

## 2019-12-30 MED ORDER — PROMETHAZINE 25 MG TAB
25 mg | Freq: Four times a day (QID) | ORAL | Status: DC | PRN
Start: 2019-12-30 — End: 2020-01-01

## 2019-12-30 MED ORDER — OXYTOCIN 0.06 UNIT/ML BOLUS FROM BAG (POST-PARTUM)
30 unit/500 mL | INTRAVENOUS | Status: DC | PRN
Start: 2019-12-30 — End: 2019-12-30

## 2019-12-30 MED ORDER — HYDROCODONE-ACETAMINOPHEN 5 MG-325 MG TAB
5-325 mg | ORAL | Status: DC | PRN
Start: 2019-12-30 — End: 2020-01-01
  Administered 2019-12-31 (×2): via ORAL

## 2019-12-30 MED ORDER — OXYTOCIN 30 UNIT IN 500 ML INFUSION
30 unit/500 mL | INTRAVENOUS | Status: DC | PRN
Start: 2019-12-30 — End: 2020-01-01

## 2019-12-30 MED ORDER — IBUPROFEN 800 MG TAB
800 mg | Freq: Three times a day (TID) | ORAL | Status: DC | PRN
Start: 2019-12-30 — End: 2020-01-01
  Administered 2019-12-31 – 2020-01-01 (×5): via ORAL

## 2019-12-30 MED ORDER — SODIUM CHLORIDE 0.9 % IJ SYRG
INTRAMUSCULAR | Status: DC | PRN
Start: 2019-12-30 — End: 2020-01-01

## 2019-12-30 MED ORDER — FENTANYL-BUPIVACAINE IN NS (PF) 2 MCG/ML-0.125 % PUMP RESERV,PREFILLED
2 mcg/mL- 0.15 % | EPIDURAL | Status: DC
Start: 2019-12-30 — End: 2019-12-30
  Administered 2019-12-30 (×2): via EPIDURAL

## 2019-12-30 MED ORDER — OXYTOCIN 30 UNIT IN 500 ML INFUSION
30 unit/500 mL | INTRAVENOUS | Status: DC
Start: 2019-12-30 — End: 2019-12-30
  Administered 2019-12-30 – 2019-12-31 (×6): via INTRAVENOUS

## 2019-12-30 MED ORDER — ALUM-MAG HYDROXIDE-SIMETH 200 MG-200 MG-20 MG/5 ML ORAL SUSP
200-200-20 mg/5 mL | ORAL | Status: DC | PRN
Start: 2019-12-30 — End: 2019-12-31

## 2019-12-30 MED ORDER — NALBUPHINE 10 MG/ML INJECTION
10 mg/mL | INTRAMUSCULAR | Status: AC
Start: 2019-12-30 — End: 2019-12-30

## 2019-12-30 MED ORDER — RHO D IMMUNE GLOBULIN 300 MCG IM SYRG
1500 unit (300 mcg) | Freq: Once | INTRAMUSCULAR | Status: AC | PRN
Start: 2019-12-30 — End: 2019-12-31

## 2019-12-30 MED ORDER — LACTATED RINGERS IV
INTRAVENOUS | Status: DC
Start: 2019-12-30 — End: 2019-12-30

## 2019-12-30 MED ORDER — OXYTOCIN 30 UNIT IN 500 ML INFUSION
30 unit/500 mL | INTRAVENOUS | Status: DC
Start: 2019-12-30 — End: 2019-12-30

## 2019-12-30 MED ORDER — SIMETHICONE 80 MG CHEWABLE TAB
80 mg | Freq: Four times a day (QID) | ORAL | Status: DC | PRN
Start: 2019-12-30 — End: 2020-01-01

## 2019-12-30 MED ORDER — LIDOCAINE-EPINEPHRINE 1.5 %-1:200,000 IJ SOLN
1.5 %-1:200,000 | INTRAMUSCULAR | Status: AC
Start: 2019-12-30 — End: 2019-12-30
  Administered 2019-12-30: 14:00:00 via EPIDURAL

## 2019-12-30 MED ORDER — OXYTOCIN 0.06 UNIT/ML BOLUS FROM BAG (POST-PARTUM)
30 unit/500 mL | INTRAVENOUS | Status: DC | PRN
Start: 2019-12-30 — End: 2020-01-01

## 2019-12-30 MED ORDER — DOCUSATE SODIUM 100 MG CAP
100 mg | Freq: Every day | ORAL | Status: DC | PRN
Start: 2019-12-30 — End: 2020-01-01

## 2019-12-30 MED ORDER — LACTATED RINGERS IV
INTRAVENOUS | Status: DC
Start: 2019-12-30 — End: 2019-12-30
  Administered 2019-12-30 (×4): via INTRAVENOUS

## 2019-12-30 MED ORDER — HYDROCORTISONE-PRAMOXINE 2.5 %-1 % (4G) RECTAL CREAM
RECTAL | Status: DC | PRN
Start: 2019-12-30 — End: 2019-12-30

## 2019-12-30 MED ORDER — MEASLES,MUMPS&RUBELLA VACCIN(PF) 1,000-12,500 TCID50/0.5 ML SUB-Q SUSP
1000-12500 TCID50/0.5 mL | SUBCUTANEOUS | Status: DC
Start: 2019-12-30 — End: 2020-01-01

## 2019-12-30 MED FILL — PENICILLIN G POT 2.5 MILLION UNITS IN 50 ML 0.9% NACL: 2.5 MU | INTRAVENOUS | Qty: 50

## 2019-12-30 MED FILL — FENTANYL-BUPIVACAINE IN NS (PF) 2 MCG/ML-0.125 % PUMP RESERV,PREFILLED: 2 mcg/mL- 0.15 % | EPIDURAL | Qty: 100

## 2019-12-30 MED FILL — LACTATED RINGERS IV: INTRAVENOUS | Qty: 1000

## 2019-12-30 MED FILL — OXYTOCIN 30 UNIT IN 500 ML INFUSION: 30 unit/500 mL | INTRAVENOUS | Qty: 500

## 2019-12-30 MED FILL — BD POSIFLUSH NORMAL SALINE 0.9 % INJECTION SYRINGE: INTRAMUSCULAR | Qty: 40

## 2019-12-30 MED FILL — PFIZERPEN-G 5 MILLION UNIT SOLUTION FOR INJECTION: 5 million unit | INTRAMUSCULAR | Qty: 5

## 2019-12-30 MED FILL — GLYCERIN-WITCH HAZEL 12.5 %-50 % TOPICAL PADS: CUTANEOUS | Qty: 1

## 2019-12-30 NOTE — Anesthesia Procedure Notes (Signed)
CSE Block    Start time: 12/30/2019 9:45 AM  End time: 12/30/2019 10:00 AM  Performed by: Rosaria Ferries, CRNA  Authorized by: Tristan Schroeder, MD     Pre-Procedure  preanesthetic checklist: patient identified, risks and benefits discussed, anesthesia consent, site marked, patient being monitored and timeout performed    Timeout Time: 09:47 EDT        Procedure:   Patient Position:  Seated  Prep Region:  Lumbar  Prep: chlorhexidine    Location:  L3-4    Epidural Needle:   Needle Type:  Tuohy  Needle Gauge:  18 G  Injection Technique:  Loss of resistance using saline  Attempts:  1    Spinal Needle:   Needle Type:  Pencan  Needle Gauge:  25 G    Catheter:   Catheter Type:  Standard  Catheter Size:  20 G  Catheter at Skin Depth (cm):  7.5  Depth in Epidural Space (cm):  5  Events: no blood with aspiration, no paresthesia and CSF confirmed    Test Dose:  Negative    Assessment:   Catheter Secured:  Tegaderm and tape  Insertion:  Uncomplicated  Patient tolerance:  Patient tolerated the procedure well with no immediate complications  CSE dosed with 51ml of 0.25% isobaric bupivacaine in intrathecal space.  Then spinal needle removed from Tuohy and epidural catheter threaded without difficulty.  Epidural test dose negative.

## 2019-12-30 NOTE — H&P (Signed)
H&P by Baruch Merl,  MD at 12/30/19 1350                Author: Baruch Merl, MD  Service: Obstetrics & Gynecology  Author Type: Physician       Filed: 12/30/19 1354  Date of Service: 12/30/19 1350  Status: Signed          Editor: Baruch Merl, MD (Physician)                         [facility]   [facility]   [email or web address]   [phone]      Alfonse Ras, MD, FACOG        Labor and Delivery History & Physical   Template updated 07/2019          Name: Marissa Welch  MRN: [id number]   SSN: [id number]          Date of Birth: May 11, 1995   Age: 24 y.o.   Sex: female              Subjective:        Estimated Date of Delivery: 01/03/20     OB History                Gravida      4                Para      2                Term      2                Preterm                       AB      1                Living      2                             SAB                       TAB                       Ectopic      1                Molar                       Multiple                       Live Births      2                            Obstetric Comments      Menarche:  33.   LMP: n/a.  # of Children:  1.  Age at Delivery of First Child:  50.   Hysterectomy/oophorectomy:  NO/NO.  Breast Bx:   no.  Hx of Breast Feeding:  yes.  BCP:  no. Hormone therapy:  no.  Ms. Pricilla Holmucker is arrived to L and  D from home with a pregnancy at 1855w3d for labor .    Prenatal course was normal. Please see prenatal records for details.      Patient reports good fetal movement, no loss or gush of fluid, no vaginal bleeding, no significant or increase in vaginal discharge       Patient denies chest pain, shortness of breath, right upper quadrant pain, vision changes           Past Medical History:        Diagnosis  Date         ?  Anxiety       ?  Asthma            rescue inhaler (used prior to pregnancy)        No past surgical history on file.     Social History           Socioeconomic History         ?  Marital status:  SINGLE              Spouse name:  Not on file         ?  Number of children:  Not on file     ?  Years of education:  Not on file     ?  Highest education level:  Not on file       Tobacco Use         ?  Smoking status:  Never Smoker     ?  Smokeless tobacco:  Never Used       Vaping Use         ?  Vaping Use:  Never used       Substance and Sexual Activity         ?  Alcohol use:  No     ?  Drug use:  Never     ?  Sexual activity:  Yes              Partners:  Male         Birth control/protection:  None        Other Topics  Concern          Social Determinants of Health          Financial Resource Strain:         ?  Difficulty of Paying Living Expenses:        Food Insecurity:         ?  Worried About Programme researcher, broadcasting/film/videounning Out of Food in the Last Year:      ?  Baristaan Out of Food in the Last Year:        Transportation Needs:         ?  Freight forwarderLack of Transportation (Medical):      ?  Lack of Transportation (Non-Medical):        Physical Activity:         ?  Days of Exercise per Week:      ?  Minutes of Exercise per Session:        Stress:         ?  Feeling of Stress :        Social Connections:         ?  Frequency of Communication with Friends and Family:      ?  Frequency of Social Gatherings with Friends and  Family:      ?  Attends Religious Services:      ?  Active Member of Clubs or Organizations:      ?  Attends Banker Meetings:         ?  Marital Status:           Family History         Problem  Relation  Age of Onset          ?  Heart Disease  Mother       ?  Diabetes  Paternal Grandfather       ?  Breast Cancer  Maternal Grandmother       ?  Hypertension  Maternal Grandmother       ?  Hypertension  Maternal Grandfather            ?  Hypertension  Paternal Grandmother               Allergies        Allergen  Reactions         ?  Latex  Rash         ?  Peanut  Anaphylaxis          Prior to Admission medications             Medication  Sig  Start Date  End Date   Taking?  Authorizing Provider            prenatal vit-iron fumarate-fa (PRENATAL VITAMIN WITH MINERALS) 28-0.8 mg tab  Take 1 Tab by mouth daily.  06/30/12    Yes  Jamal Collin, MD              Active Hospital Problems           Diagnosis  Date Noted         ?  Labor and delivery indication for care or intervention  12/30/2019           Review of Systems: A comprehensive review of systems was negative except for that written in the HPI.   Constitutional: negative for fevers, chills and weight loss   ENT ROS: negative for - hearing change, oral lesions or visual changes   Respiratory: negative for cough, wheezing or dyspnea on exertion   Cardiovascular: negative for chest pain, irregular heart beats, exertional chest pressure/discomfort   Gastrointestinal: negative for dysphagia, nausea and vomiting   Genito-Urinary ROS: see HPI   Inteument/breast: negative for rash, breast lump and nipple discharge   Musculoskeletal:negative for stiff joints, neck pain and muscle weakness   Endocrine ROS: negative for - breast changes, galactorrhea or temperature intolerance   Hematological and Lymphatic ROS: negative for - bruising or swollen lymph nodes           Objective:        Vitals:   Patient Vitals for the past 4 hrs:            Temp  Pulse  Resp  BP  SpO2            12/30/19 1214  --  75  --  (!) 93/50  99 %            12/30/19 1211  --  74  --  (!) 90/57  --     12/30/19 1209  --  --  --  --  99 %     12/30/19 1208  --  78  --  (!)  89/58  --     12/30/19 1200  --  76  --  (!) 89/57  --     12/30/19 1159  --  --  --  --  98 %     12/30/19 1119  --  --  --  --  99 %     12/30/19 1115  --  72  --  109/73  --     12/30/19 1114  --  --  --  --  99 %     12/30/19 1109  --  --  --  --  100 %     12/30/19 1104  --  --  --  --  99 %     12/30/19 1100  97.6 ??F (36.4 ??C)  90  14  (!) 94/50  100 %     12/30/19 1045  --  85  --  103/63  --     12/30/19 1044  --  --  --  --  100 %     12/30/19 1039  --  --  --  --  99 %     12/30/19  1029  --  91  --  (!) 101/43  100 %     12/30/19 1025  --  88  --  (!) 110/57  --     12/30/19 1024  --  --  --  --  99 %     12/30/19 1023  --  81  --  109/61  --     12/30/19 1021  --  93  --  111/63  --     12/30/19 1019  --  --  --  --  99 %     12/30/19 1017  --  85  --  102/60  --     12/30/19 1014  --  87  --  (!) 95/59  95 %     12/30/19 1010  --  82  --  105/62  --     12/30/19 1009  --  --  --  --  100 %            12/30/19 1007  --  91  --  (!) 102/58  --             Physical Exam:   Patient without distress.   Heart: Regular rate and rhythm or S1S2 present   Lung: clear to auscultation throughout lung fields, no wheezes, no rales, no rhonchi and normal respiratory effort   Abdomen: soft, nontender   Fundus: soft and non tender   Cervical Exam:    Cervical Exam   Dilation (cm): 6   Eff: 100 %   Station: -2   Vaginal exam done by? : Trew Sunde   Membrane Status: AROM         Lower Extremities: no c/t/ e      Membranes:  Artificial Rupture of Membranes; Amniotic Fluid: medium amount of clear fluid      Prenatal Labs:      Lab Results         Component  Value  Date/Time            Rubella, External  Immune  12/30/2011 12:00 AM       GrBStrep, External  Negative  06/26/2012 12:00 AM       HBsAg, External  negative  12/30/2011 12:00 AM       HIV, External  negative  12/30/2011 12:00 AM  RPR, External  nonreactive  12/30/2011 12:00 AM       Gonorrhea, External  negative  12/30/2011 12:00 AM            Chlamydia, External  negative  12/30/2011 12:00 AM                 Recent Results (from the past 12 hour(s))     CBC WITH AUTOMATED DIFF          Collection Time: 12/30/19  9:10 AM         Result  Value  Ref Range            WBC  20.4 (H)  3.6 - 11.0 K/uL       RBC  4.03  3.80 - 5.20 M/uL       HGB  9.9 (L)  11.5 - 16.0 g/dL       HCT  96.0 (L)  45.4 - 47.0 %       MCV  82.4  80.0 - 99.0 FL       MCH  24.6 (L)  26.0 - 34.0 PG       MCHC  29.8 (L)  30.0 - 36.5 g/dL       RDW  09.8 (H)  11.9 - 14.5 %        PLATELET  301  150 - 400 K/uL       MPV  9.9  8.9 - 12.9 FL       NRBC  0.0  0 PER 100 WBC       ABSOLUTE NRBC  0.00  0.00 - 0.01 K/uL       NEUTROPHILS  78 (H)  32 - 75 %       LYMPHOCYTES  10 (L)  12 - 49 %       MONOCYTES  8  5 - 13 %       EOSINOPHILS  0  0 - 7 %       BASOPHILS  1  0 - 1 %       IMMATURE GRANULOCYTES  3 (H)  0.0 - 0.5 %       ABS. NEUTROPHILS  16.0 (H)  1.8 - 8.0 K/UL       ABS. LYMPHOCYTES  2.0  0.8 - 3.5 K/UL       ABS. MONOCYTES  1.6 (H)  0.0 - 1.0 K/UL       ABS. EOSINOPHILS  0.0  0.0 - 0.4 K/UL       ABS. BASOPHILS  0.2 (H)  0.0 - 0.1 K/UL       ABS. IMM. GRANS.  0.6 (H)  0.00 - 0.04 K/UL       DF  SMEAR SCANNED          RBC COMMENTS  NORMOCYTIC, NORMOCHROMIC          WBC COMMENTS  REACTIVE LYMPHS          COVID-19 RAPID TEST          Collection Time: 12/30/19  9:25 AM         Result  Value  Ref Range            Specimen source  Nasopharyngeal               COVID-19 rapid test  Not detected  NOTD  Assessment/Plan:     24 y.o. G4 P2012 [redacted]w[redacted]d   labor   The patient does have anemia   GBS positive      Reassuring fetal surveillance        Past Medical History:        Diagnosis  Date         ?  Anxiety       ?  Asthma            rescue inhaler (used prior to pregnancy)              Plan: Admit for labor.    CEFM/TOCO   Anticipate SVD         Signed By:   Baruch Merl, MD           December 30, 2019             NST Inpatient Procedure Note      Marissa Welch presents for fetal non-stress test.     Indication is labor, abd pain in pregnancy.      She is [redacted]w[redacted]d. She has been monitored for more than 60 minutes.   The FHR was reactive.      NST Interpretation:    FHR baseline 130 bpm,    Variability:  moderate   Accelerations:  present   Decelerations Absent.   Uterine contractions:  present      Assessment   NST is reactive.    NST is reassuring.      Patient does need admission/observation for further monitoring.      Marissa Welch was informed of the NST results and her questions  were answered.      Plan:     []  Continue admission to labor and delivery     []  Continue observation    []  Keep routine OB follow up upon discharge    []  Reviewed fetal movement kick counts, notify MD  if decreased    []      []

## 2019-12-30 NOTE — Progress Notes (Signed)
Labor Progress Note  Patient seen, fetal heart rate and contraction pattern evaluated, patient examined.  Patient is comfortable with epidural.  Visit Vitals  BP 110/82   Pulse 85   Temp 98.5 ??F (36.9 ??C)   Resp 14   Ht 5\' 4"  (1.626 m)   Wt 88.5 kg (195 lb)   SpO2 98%   BMI 33.47 kg/m??       Physical Exam:    24 y.o. G4 P2012 in no acute distress.    Cervical Exam:   Presentation ROP  Dilation 10 cms   Effacement 100 %   Station +1  Membranes: Prior amniotomy: Clear  Uterine Activity:   Frequency: Every 2 - 3 minutes   Duration: 80 seconds   Intensity: Moderate  Fetal Heart Rate:    Baseline: 135 bpm   Variability: Moderate   Accelerations: Present (15 x 15 bpm)   Decelerations: None  Category: 1      Recent Results (from the past 12 hour(s))   CBC WITH AUTOMATED DIFF    Collection Time: 12/30/19  9:10 AM   Result Value Ref Range    WBC 20.4 (H) 3.6 - 11.0 K/uL    RBC 4.03 3.80 - 5.20 M/uL    HGB 9.9 (L) 11.5 - 16.0 g/dL    HCT 02/29/20 (L) 29.9 - 47.0 %    MCV 82.4 80.0 - 99.0 FL    MCH 24.6 (L) 26.0 - 34.0 PG    MCHC 29.8 (L) 30.0 - 36.5 g/dL    RDW 37.1 (H) 69.6 - 14.5 %    PLATELET 301 150 - 400 K/uL    MPV 9.9 8.9 - 12.9 FL    NRBC 0.0 0 PER 100 WBC    ABSOLUTE NRBC 0.00 0.00 - 0.01 K/uL    NEUTROPHILS 78 (H) 32 - 75 %    LYMPHOCYTES 10 (L) 12 - 49 %    MONOCYTES 8 5 - 13 %    EOSINOPHILS 0 0 - 7 %    BASOPHILS 1 0 - 1 %    IMMATURE GRANULOCYTES 3 (H) 0.0 - 0.5 %    ABS. NEUTROPHILS 16.0 (H) 1.8 - 8.0 K/UL    ABS. LYMPHOCYTES 2.0 0.8 - 3.5 K/UL    ABS. MONOCYTES 1.6 (H) 0.0 - 1.0 K/UL    ABS. EOSINOPHILS 0.0 0.0 - 0.4 K/UL    ABS. BASOPHILS 0.2 (H) 0.0 - 0.1 K/UL    ABS. IMM. GRANS. 0.6 (H) 0.00 - 0.04 K/UL    DF SMEAR SCANNED      RBC COMMENTS NORMOCYTIC, NORMOCHROMIC      WBC COMMENTS REACTIVE LYMPHS     COVID-19 RAPID TEST    Collection Time: 12/30/19  9:25 AM   Result Value Ref Range    Specimen source Nasopharyngeal      COVID-19 rapid test Not detected NOTD       Assessment/Plan:  GBS +  Fully dilated start  pushing  02/29/20, MD  12/30/2019

## 2019-12-30 NOTE — Progress Notes (Signed)
Pt c 9cm ant lip and cervix on left.  ROP.  Pateints partner reports last baby delivered OP.  Disc options with patient. Observe/rotate.  Pt agreeable to manual rotation.  Infant rotated to ROT, moderately tolerated with moderate maternal discomfort.  Patient positioned on left side.  Will observe for progress.    Baruch Merl, MD

## 2019-12-30 NOTE — Progress Notes (Signed)
1010: SBAR given to this RN by A. LaBrake RN at this time. This RN at bedside introducing self to pt. Siri Cole SN with this RN.    1100: Foley placed at this time per protocol.     1105: Pt placed in throne's position with foot of bed lowered.     1140: Pt turned to R side with peanut ball in between legs.     1147: Dr. Tracey Harries at bedside assessing and speaking with pt regarding POC. Per MD- will wait for SVE and AROM until after 2nd dose of antibiotic d/t GBS status.     1320: Pt turned to L side with peanut ball in between legs.     1325: Dr. Tracey Harries at bedside . AROM by Dr. Tracey Harries at this time, clear/moderate amount/no odor. SVE per MD- pt 6/100/-2. Pads changed. Pt turned back to lateral L side with peanut ball in between legs.     1406: Pt placed in closed knee position on L side.     1415: Pt c/o urge to push intermittently, this RN educating pt that will reasses shortly, and will SVE if necessary. Pt educated on reason for fewer exams d/t GBS/AROM.     1435: SVE per this RN d/t pt c/o increased rectal pressure. Pt 8/100/-1.     1438: Pt repositioned to R side in closed knee with peanut ball in between legs.     1549: SVE per Dr. Tracey Harries. Per MD pt still has cervix, will not push at this time, not able to reduce.     1654: Pt repositioned into lateral L side in modified whelcher's position.     1649: With pt consent, Dr. Tracey Harries attempting to manually rotate fetal head from ROP to ROA. Per MD, able to rotate to OT. SVE per Dr. Tracey Harries- pt still has ant lip and cervix on L side, pt unable to push at this time.      1715: Dr. Tracey Harries called, per MD start pitocin on pt at 56ml/hr in order to see if contraction pattern improves.     1742: Pitocin started at 9ml/hr per Dr. Dorthula Nettles.     1800: Pt states she is comfortable, does not want to turn at this time.     1810" Dr. Tracey Harries at nurses' station     [phone]: Bedside and Verbal shift change report given to C. Culbreth RN (Cabin crew) by Richrd Prime RN  (offgoing nurse). Report included the following information SBAR, Kardex, Procedure Summary, Intake/Output, MAR, Recent Results, Med Rec Status and Quality Measures.

## 2019-12-30 NOTE — Progress Notes (Signed)
Labor Progress Note    Patient seen, fetal heart rate and contraction pattern evaluated.   Patient c/o increase pressure and urge to push.    Physical Exam:  Visit Vitals  BP 100/62   Pulse 76   Temp 97.5 ??F (36.4 ??C)   Resp 14   Ht 5\' 4"  (1.626 m)   Wt 195 lb (88.5 kg)   SpO2 100%   BMI 33.47 kg/m??       Cervical Exam: c/8/0    Membranes:  sp arom      Assessment/Plan:  24 y.o. G4 P2012 [redacted]w[redacted]d     Reassuring fetal status.     Anticipate SVD  CEFM/TOCO  Will reposition, hold on pushing until cervix reducible.  Discussed plan with patient and nursing staff    [redacted]w[redacted]d, MD      NST Inpatient Procedure Note    Marissa Welch presents for fetal non-stress test.    Indication is labor.    She is [redacted]w[redacted]d. She has been monitored for more than 60 minutes.  The FHR was reactive.    NST Interpretation:   FHR baseline 130 bpm,   Variability moderate  Accelerations present.  Decelerations Absent.  Uterine contractions were q 3 minutes     Assessment  NST is reactive.   NST is reassuring.    Patient does need admission/observation for further monitoring.      Plan:    [x]  Continue admission to labor and delivery    []  Continue observation   []  Keep routine OB follow up upon discharge   []  Reviewed fetal movement kick counts, notify MD if decreased   []     [] 

## 2019-12-30 NOTE — Progress Notes (Signed)
2778: Patient arrived to L&D room 206 with complaints of contractions. Pt oriented to room and unit, plan of care discussed with pt. Pt verbalized understanding.     0900: SVE per this RN 4-5/80-2. Dr. Tracey Harries updated on pt via perfect serve.    0945: CRNA Williams at bedside to place epidural. CRNA reviewing procedure with pt. Pt verbalized understanding.     2423: Timeout performed with CRNA.    [phone]: Test dose.    1002: Epidural procedure complete. Pt repositioned back in bed.    1010: OB SBAR report to S. Copywriter, advertising. Care turned over at this time.

## 2019-12-30 NOTE — Progress Notes (Signed)
12/30/2019  10:53 AM    CM met with MOB to complete initial assessment and begin discharge planning.  MOB verified and confirmed demographics.  MOB lives with Jackquline Denmark [phone]), along with MOB's 54 and 26 yr old, at the address on file. MOB does not work, and plans to be home with infant. FOB is employed and will be taking adequate time off. MOB reports she has good family support.  MOB plans to bottle feed baby.  [facility] will provide follow up care for infant. MOB has car seat, bassinet/crib, clothing, bottles and all necessary supplies for baby. MOB has [employer] Medicaid, and will be adding baby to this policy. CM discussed process to add baby to insurance, MOB verbalized understanding. MOB is also interested in enrolling in [facility] services, CM provided MOB with contact information for [facility] clinic.  Care Management Interventions  PCP Verified by CM: Yes Lavone Neri)  Mode of Transport at Discharge: Other (see comment)  Transition of Care Consult (CM Consult): Discharge Planning  Support Systems: Spouse/Significant Other  Confirm Follow Up Transport: Family  Discharge Location  Discharge Placement: Home with family assistance  Alexis Rivera,BS

## 2019-12-30 NOTE — Progress Notes (Signed)
1855- Verbal shift change report given to C. Culbreth RN (Cabin crew) by Richrd Prime RN (offgoing nurse). Report included the following information SBAR, Intake/Output, MAR and Recent Results.     21- This RN at bedside to assume care of pt     1915- Pt begins pushing at this time     66- MD called to bedside for delivery    1922Griffin Basil MD at bedside for delivery    [phone]- Delivery of viable infant female at this time     39- Cord is clamped and cut at this time    1930- Placenta delivered     1935- Perineum intact and QBL 100. Pads changed and peri care provided. Pt repositioned in bed    2137- SBAR given to B. Daphine Deutscher RN    2140- Pt complaining of cramping with no relief from medication.     2142- St cath for 700 at this time    2145- Pt states she has almost complete relief after st cath.

## 2019-12-30 NOTE — Anesthesia Pre-Procedure Evaluation (Signed)
Relevant Problems   No relevant active problems       Anesthetic History   No history of anesthetic complications            Review of Systems / Medical History  Patient summary reviewed, nursing notes reviewed and pertinent labs reviewed    Pulmonary            Asthma : well controlled       Neuro/Psych         Psychiatric history    Comments: Anxiety Cardiovascular  Within defined limits                     GI/Hepatic/Renal  Within defined limits              Endo/Other  Within defined limits           Other Findings   Comments: G4P2           Physical Exam    Airway  Mallampati: II  TM Distance: 4 - 6 cm  Neck ROM: normal range of motion   Mouth opening: Normal     Cardiovascular    Rhythm: regular  Rate: normal         Dental    Dentition: Lower dentition intact and Upper dentition intact     Pulmonary  Breath sounds clear to auscultation               Abdominal  GI exam deferred       Other Findings            Anesthetic Plan      Anesthesia type: epidural            Anesthetic plan and risks discussed with: Patient

## 2019-12-30 NOTE — Progress Notes (Signed)
2140: SBAR report given to this RN from [employer] RN.  Culbreth RN stating she will straight cath pt.    2200: Fundus firm -2, small bleeding noted.  Pt informed of transfer to room 213.    2240: Pt standing up OOB with this RN.  Steady gait noted.  Pt given clean underwear and pads, clean gown provided.  Pt able to sit in wheelchair.    2245: Pt in room 213 att.  Instructed on call bell use, pt without complaints.    2345: Pt OOB to bathroom with this RN, steady gait noted.  Pt able to void 500 cc clear urine.  Epidural catheter removed with blue tip intact. Peri care provided and pt assisted back to bed.    0050:  Pt OOB to bathroom with this RN, steady gait noted.  Pt able to void 500 cc clear urine.  Peri care provided and pt assisted back to bed.    0310: PIV removed at this time.  Pt reports she has been voiding independently, denies the need for pain medication ATT.    0715: Bedside and Verbal shift change report given to P Daryl Eastern RN (oncoming nurse) by Tyrone Nine RN (offgoing nurse). Report included the following information SBAR, Kardex, Procedure Summary, Intake/Output, MAR, Accordion, Recent Results and Med Rec Status.

## 2019-12-31 MED ORDER — IBUPROFEN 600 MG TAB
600 mg | ORAL_TABLET | Freq: Four times a day (QID) | ORAL | 0 refills | Status: AC | PRN
Start: 2019-12-31 — End: ?

## 2019-12-31 MED FILL — PROMETHAZINE 25 MG TAB: 25 mg | ORAL | Qty: 1

## 2019-12-31 MED FILL — IBUPROFEN 800 MG TAB: 800 mg | ORAL | Qty: 1

## 2019-12-31 MED FILL — HYDROCODONE-ACETAMINOPHEN 5 MG-325 MG TAB: 5-325 mg | ORAL | Qty: 1

## 2019-12-31 NOTE — Progress Notes (Signed)
TRANSFER - OUT REPORT:    Marissa Welch  being transferred to MIU(unit) for routine progression of care    Primary RN taking care of mother and infant transferred up with couplet to continue care on MIU.   Patient transported with:   Registered Nurse

## 2019-12-31 NOTE — Discharge Summary (Signed)
Obstetrical Discharge Summary     Name: Marissa Welch MRN: [id number]  SSN: [id number]    Date of Birth: October 21, 1995  Age: 24 y.o.  Sex: female      Admit Date: 12/30/2019    Discharge Date: 01/01/2020     Admitting Physician: Baruch Merl, MD     Attending Physician:  Jamal Collin, MD     Admission Diagnoses: Labor and delivery indication for care or intervention [O75.9]    Condition on Discharge: Stable    Procedures: SVD    Disposition: to home    Follow up:   Follow-up Appointments   Procedures   ??? FOLLOW UP VISIT Appointment in: 6 Weeks Dr. Providence Lanius     Dr. Providence Lanius     Standing Status:   Standing     Number of Occurrences:   1     Order Specific Question:   Appointment in     Answer:   6 Weeks        Discharge Diagnoses:   Information for the patient's newborn:  Jeanmarie, Mccowen [[id number]]   Delivery of a 7 lb 6.2 oz (3.35 kg) female infant via Vaginal, Spontaneous on 12/30/2019 at 7:26 PM  by Ileana Ladd. Apgars were 9  and 9 .       Additional Diagnoses:   Hospital Problems  Date Reviewed: 12/28/19        Codes Class Noted POA    Labor and delivery indication for care or intervention ICD-10-CM: O75.9  ICD-9-CM: 659.90  12/30/2019 Unknown             Lab Results   Component Value Date/Time    Rubella, External Immune 12/30/2011 12:00 AM    GrBStrep, External Negative 06/26/2012 12:00 AM       Hospital Course: Normal hospital course following the delivery.    Patient Instructions:   Current Discharge Medication List      START taking these medications    Details   ibuprofen (MOTRIN) 600 mg tablet Take 1 Tablet by mouth every six (6) hours as needed for Pain. Take with food.  Qty: 30 Tablet, Refills: 0         CONTINUE these medications which have NOT CHANGED    Details   prenatal vit-iron fumarate-fa (PRENATAL VITAMIN WITH MINERALS) 28-0.8 mg tab Take 1 Tab by mouth daily.  Qty: 90 Tab, Refills: 3    Associated Diagnoses: Supervision of normal first pregnancy             Reference my discharge  instructions.      Signed By:  Baruch Merl, MD     December 31, 2019

## 2019-12-31 NOTE — Progress Notes (Signed)
Post-Partum Day Number 1 Progress Note    Marissa Welch       Information for the patient's newborn:  Kennette, Cuthrell [[id number]]   Vaginal, Spontaneous    Patient doing well without significant complaint.  Voiding without difficulty, normal lochia.      Vitals:  Visit Vitals  BP 119/66   Pulse 61   Temp 98.1 ??F (36.7 ??C)   Resp 16   Ht 5\' 4"  (1.626 m)   Wt 195 lb (88.5 kg)   LMP 03/29/2019 (Exact Date)   SpO2 95%   Breastfeeding Unknown   BMI 33.47 kg/m??     Temp (24hrs), Avg:97.9 ??F (36.6 ??C), Min:97.3 ??F (36.3 ??C), Max:98.7 ??F (37.1 ??C)        Exam:   Patient without distress.                Abdomen soft, fundus firm, nontender                Perineum with normal lochia noted.                Lower extremities are negative for swelling, cords or tenderness.      Assessment: Doing well, post partum day 1    Plan:  1. Continue routine postpartum and perineal care as well as maternal education.

## 2020-01-01 MED FILL — IBUPROFEN 800 MG TAB: 800 mg | ORAL | Qty: 1

## 2020-01-01 NOTE — Progress Notes (Signed)
Post-Partum Progress Note    Patient doing well post-partum without significant complaint.  She is voiding without difficulty, she reports normal lochia. Her pain is well controlled with oral pain medication. She is tolerating a general diet.    Delivery information:  Information for the patient's newborn:  Jacquette, Canales [[id number]]   Delivery of a 7 lb 6.2 oz (3.35 kg) female infant via Vaginal, Spontaneous on 12/30/2019 at 7:26 PM  by Ileana Ladd. Apgars were 9  and 9 .       Vitals:    Patient Vitals for the past 8 hrs:   BP Temp Pulse Resp SpO2   01/01/20 0800 111/75 98.3 ??F (36.8 ??C) 79 17 99 %     Temp (24hrs), Avg:98 ??F (36.7 ??C), Min:97.8 ??F (36.6 ??C), Max:98.3 ??F (36.8 ??C)    Visit Vitals  BP 111/75   Pulse 79   Temp 98.3 ??F (36.8 ??C)   Resp 17   Ht 5\' 4"  (1.626 m)   Wt 195 lb (88.5 kg)   SpO2 99%   Breastfeeding Unknown   BMI 33.47 kg/m??       Exam:    General: Patient without distress.  Abdomen: soft, fundus firm at level of umbilicus, nontender  Lower extremities: negative for cords or tenderness.    Labs: No results found for this or any previous visit (from the past 24 hour(s)).    Assessment:    1. Postpartum S/P spontaneous vaginal delivery   2. Patient doing well without significant complications    Plan:  1. Continue routine postpartum care  2. Routine perineal care and maternal education   3. Oral pain medications and bowel regimen as needed           , MD

## 2020-04-02 IMAGING — US OBSTETRIC <14 WK US AND TRANSVAGINAL OB US
1 series · 15 of 28 positions shown · non-contrast
Comparison: None.

CLINICAL DATA: Initial evaluation for acute vaginal bleeding, early
pregnancy.

EXAM:
OBSTETRIC <14 WK US AND TRANSVAGINAL OB US
TECHNIQUE: Both transabdominal and transvaginal ultrasound examinations were
performed for complete evaluation of the gestation as well as the
maternal uterus, adnexal regions, and pelvic cul-de-sac.
Transvaginal technique was performed to assess early pregnancy.

[Series 1: obstetric <14 wk us and transvaginal ob us · 15 of 45 slices shown]
[im 1/45]
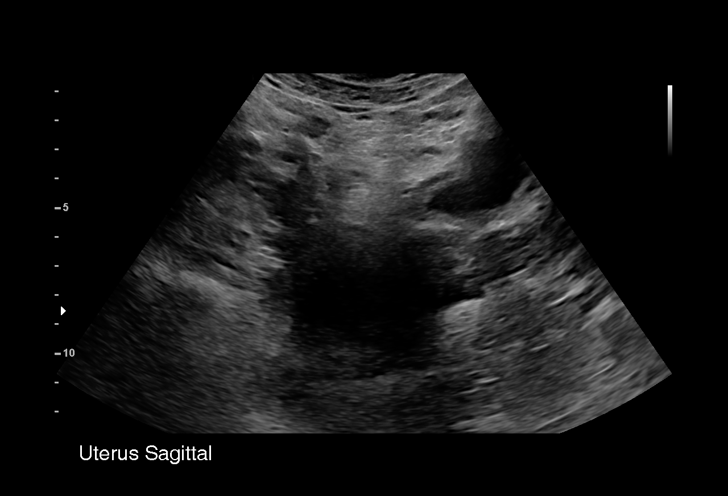
[im 4/45]
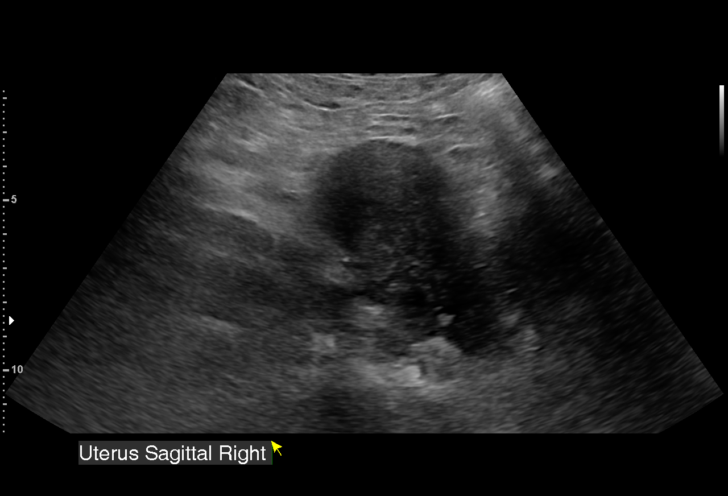
[im 7/45]
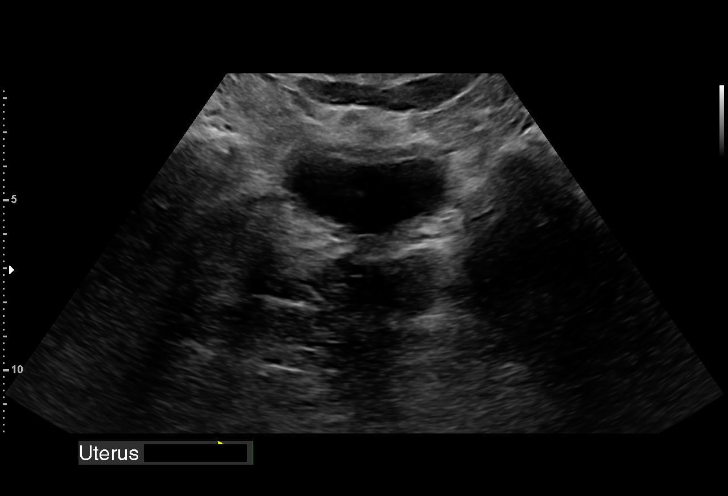
[im 10/45]
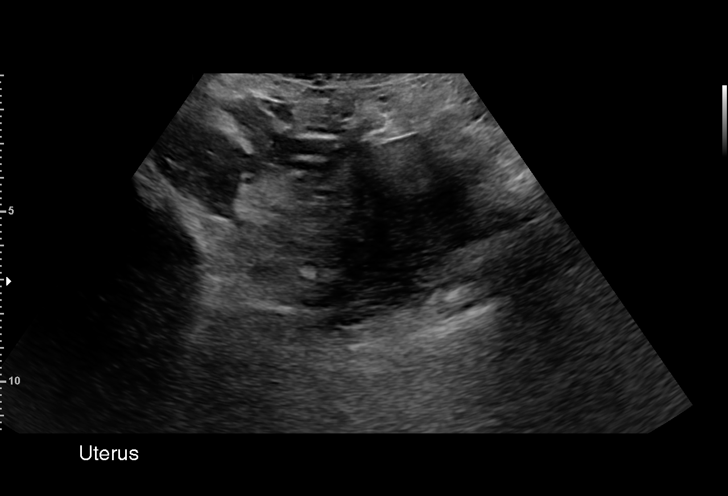
[im 14/45]
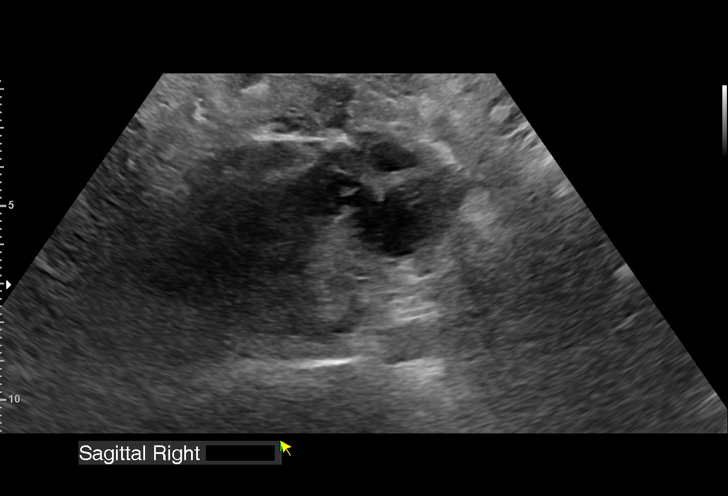
[im 17/45]
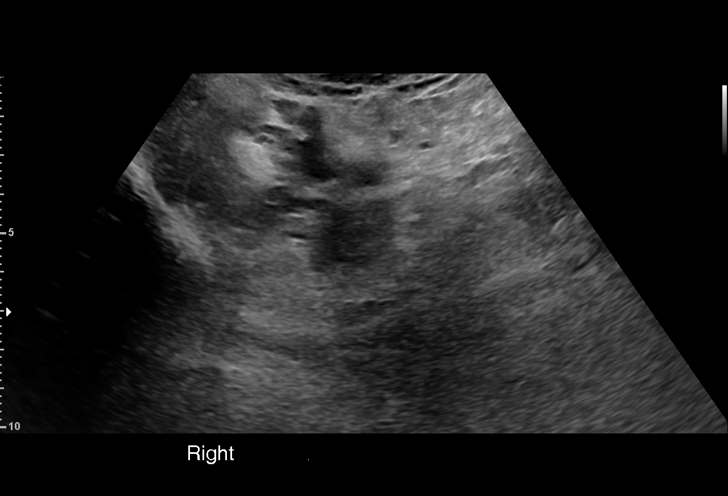
[im 20/45]
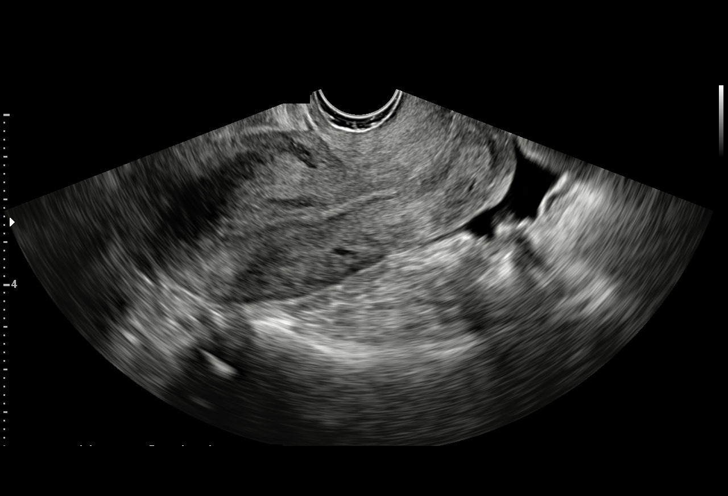
[im 23/45]
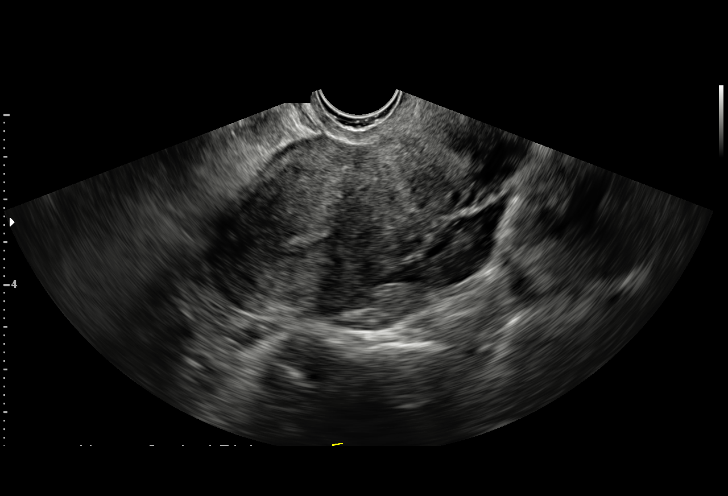
[im 25/45]
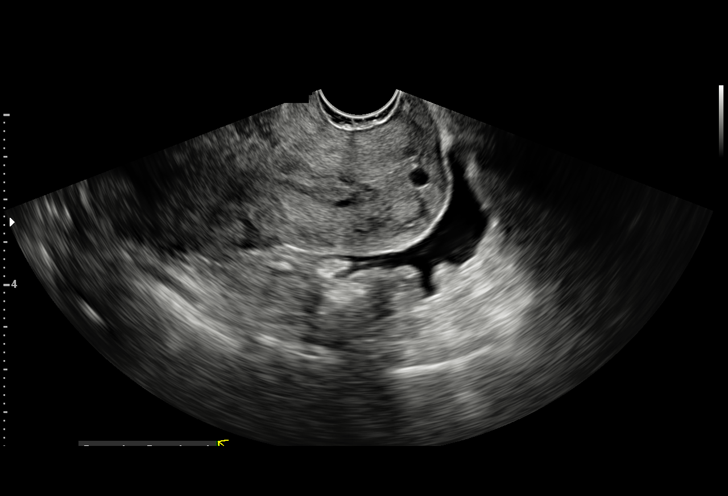
[im 28/45]
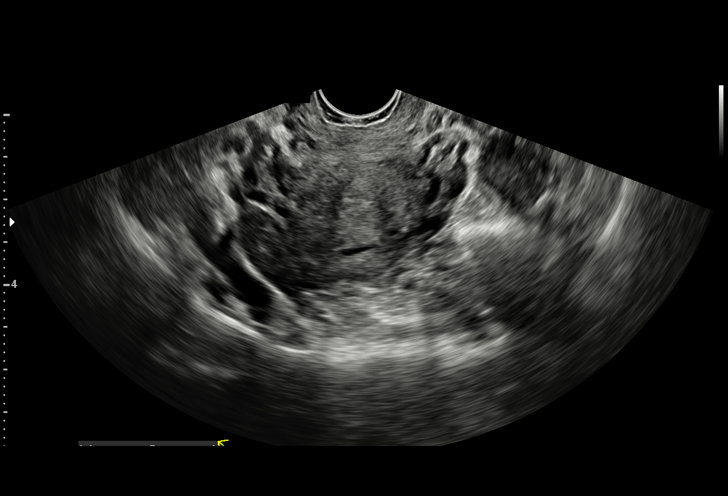
[im 31/45]
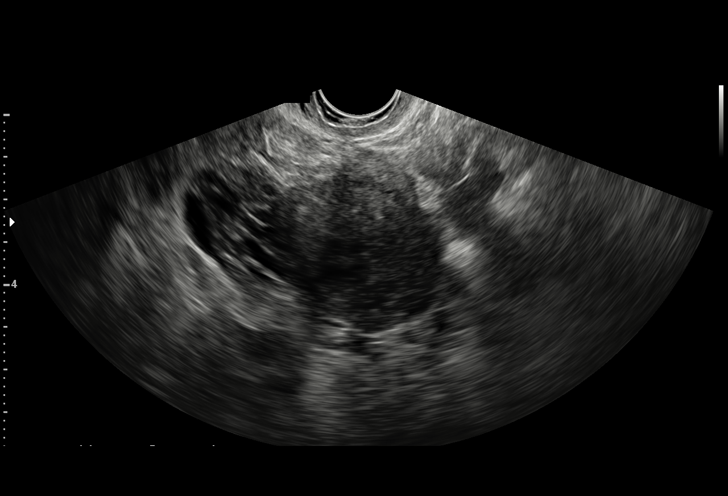
[im 35/45]
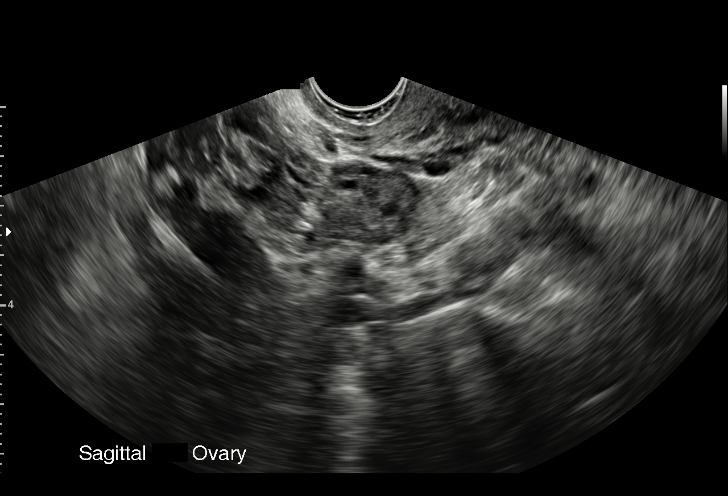
[im 38/45]
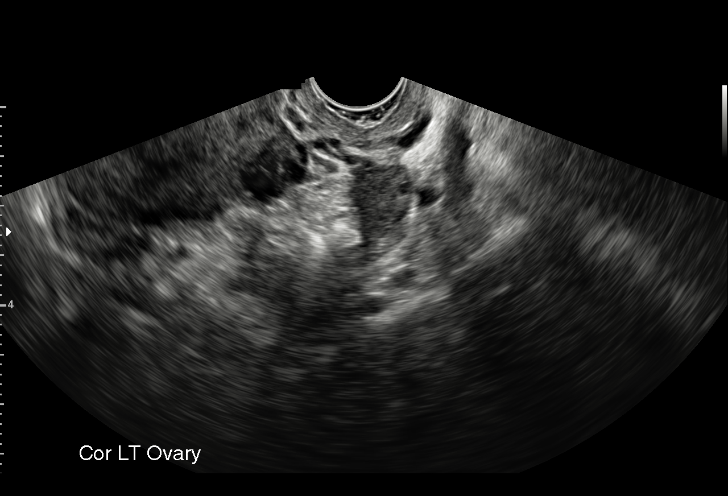
[im 41/45]
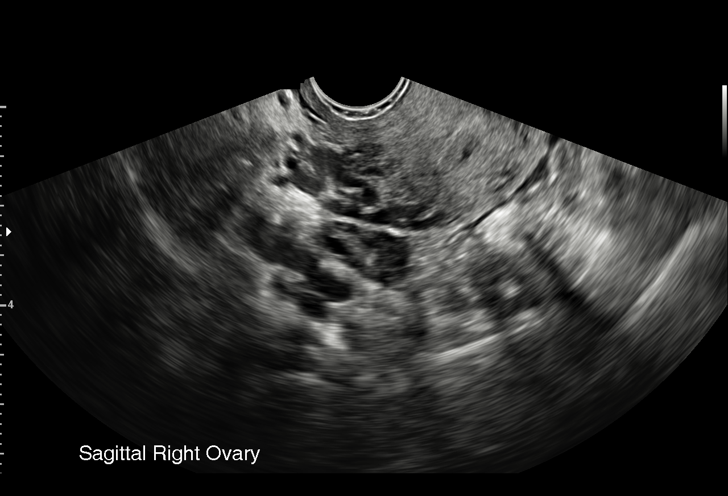
[im 45/45]
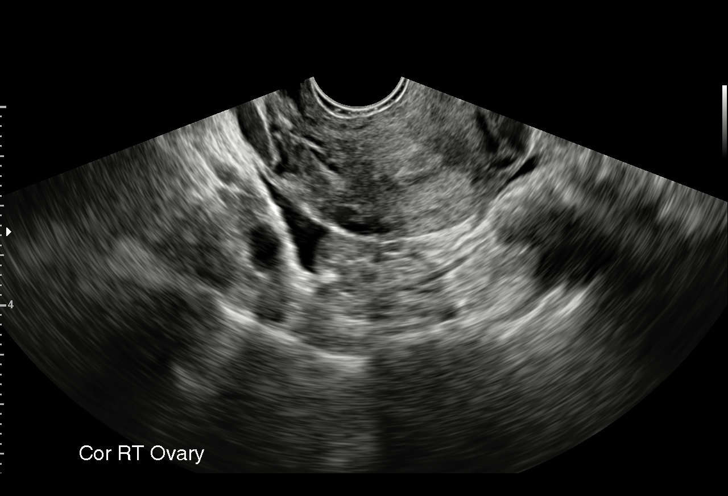

[15 of 28 positions shown; findings below may reference images not displayed]

FINDINGS: Intrauterine gestational sac: Negative.

Yolk sac:  Negative.

Embryo:  Negative.

Cardiac Activity: Negative.

Heart Rate: N/A  bpm

Subchorionic hemorrhage:  None visualized.

Maternal uterus/adnexae: Ovaries are normal in appearance
bilaterally. No adnexal mass. Small volume free fluid within the
pelvis, presumably physiologic.
IMPRESSION: 1. Early pregnancy with no discrete IUP or adnexal mass identified.
Finding is consistent with a pregnancy of unknown anatomic location.
Differential considerations include IUP to early to visualize,
recent SAB, or possibly occult ectopic pregnancy. Close clinical
monitoring with serial beta HCGs and close interval follow-up
ultrasound recommended as clinically warranted.
2. No other acute maternal uterine or adnexal abnormality
identified.

## 2022-03-06 ENCOUNTER — Inpatient Hospital Stay
Admit: 2022-03-06 | Discharge: 2022-03-06 | Disposition: A | Discharge status: Home / Self Care | Payer: MEDICAID | Attending: Emergency Medicine

## 2022-03-06 DIAGNOSIS — S39012A Strain of muscle, fascia and tendon of lower back, initial encounter: Secondary | ICD-10-CM

## 2022-03-06 MED ORDER — CYCLOBENZAPRINE HCL 10 MG PO TABS
10 MG | ORAL_TABLET | Freq: Three times a day (TID) | ORAL | 0 refills | Status: AC | PRN
Start: 2022-03-06 — End: 2022-03-16

## 2022-03-06 MED ORDER — NAPROXEN 500 MG PO TABS
500 MG | ORAL_TABLET | Freq: Two times a day (BID) | ORAL | 0 refills | Status: AC
Start: 2022-03-06 — End: ?

## 2022-03-06 MED ORDER — DIAZEPAM 5 MG PO TABS
5 MG | Freq: Once | ORAL | Status: AC
Start: 2022-03-06 — End: 2022-03-06
  Administered 2022-03-06: 11:00:00 5 mg via ORAL

## 2022-03-06 MED FILL — DIAZEPAM 5 MG PO TABS: 5 mg | ORAL | Qty: 1
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
## 2022-03-06 NOTE — Discharge Instructions (Signed)
It was a pleasure taking care of you in our Emergency Department today.  We know that when you come to [facility], you are entrusting us with your health, comfort, and safety.  Our physicians and nurses honor that trust, and truly appreciate the opportunity to care for you and your loved ones.      We also value your feedback.  If you receive a survey about your Emergency Department experience today, please fill it out.  We care about our patients' feedback, and we listen to what you have to say.  Thank you!      Dr. Tyishia Aune, MD.
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
## 2023-04-15 ENCOUNTER — Encounter: Admit: 2023-04-15 | Admitting: Family Medicine

## 2023-04-15 ENCOUNTER — Inpatient Hospital Stay
Admit: 2023-04-15 | Disposition: A | Discharge status: Home / Self Care | Payer: Medicaid (Managed Care) | Source: Ambulatory Visit | Attending: Family Medicine | Admitting: Family Medicine

## 2023-04-15 DIAGNOSIS — M5440 Lumbago with sciatica, unspecified side: Secondary | ICD-10-CM

## 2023-07-29 ENCOUNTER — Encounter: Attending: Internal Medicine
